# Patient Record
Sex: Male | Born: 1990 | Race: Black or African American | Hispanic: No | Marital: Single | Smoking: Never smoker
Health system: Southern US, Community
[De-identification: ages and names within clinical notes are randomized; demographics above are authoritative.]

## PROBLEM LIST (undated history)

## (undated) DIAGNOSIS — F32A Depression, unspecified: Secondary | ICD-10-CM

## (undated) DIAGNOSIS — K604 Rectal fistula: Secondary | ICD-10-CM

## (undated) DIAGNOSIS — K610 Anal abscess: Secondary | ICD-10-CM

## (undated) DIAGNOSIS — T7840XA Allergy, unspecified, initial encounter: Secondary | ICD-10-CM

## (undated) DIAGNOSIS — F419 Anxiety disorder, unspecified: Secondary | ICD-10-CM

## (undated) HISTORY — DX: Anxiety disorder, unspecified: F41.9

## (undated) HISTORY — PX: UPPER GASTROINTESTINAL ENDOSCOPY: SHX188

## (undated) HISTORY — PX: HERNIA REPAIR: SHX51

## (undated) HISTORY — DX: Anal abscess: K61.0

## (undated) HISTORY — DX: Allergy, unspecified, initial encounter: T78.40XA

## (undated) HISTORY — DX: Rectal fistula: K60.4

## (undated) HISTORY — DX: Depression, unspecified: F32.A

---
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
## 2013-10-17 HISTORY — PX: INCISION AND DRAINAGE PERIRECTAL ABSCESS: SHX1804

## 2013-10-18 ENCOUNTER — Ambulatory Visit (INDEPENDENT_AMBULATORY_CARE_PROVIDER_SITE_OTHER): Payer: BC Managed Care – PPO | Admitting: Surgery

## 2013-10-18 ENCOUNTER — Encounter (INDEPENDENT_AMBULATORY_CARE_PROVIDER_SITE_OTHER): Payer: Self-pay | Admitting: Surgery

## 2013-10-18 ENCOUNTER — Encounter (INDEPENDENT_AMBULATORY_CARE_PROVIDER_SITE_OTHER): Payer: Self-pay

## 2013-10-18 VITALS — BP 110/80 | HR 64 | Temp 98.2°F | Resp 14 | Ht 77.0 in | Wt 171.6 lb

## 2013-10-18 DIAGNOSIS — K610 Anal abscess: Secondary | ICD-10-CM

## 2013-10-18 DIAGNOSIS — K612 Anorectal abscess: Secondary | ICD-10-CM

## 2013-10-18 DIAGNOSIS — K602 Anal fissure, unspecified: Secondary | ICD-10-CM

## 2013-10-18 HISTORY — DX: Anal abscess: K61.0

## 2013-10-18 MED ORDER — NAPROXEN 500 MG PO TABS
500.0000 mg | ORAL_TABLET | Freq: Two times a day (BID) | ORAL | Status: DC
Start: 2013-10-18 — End: 2013-12-04

## 2013-10-18 MED ORDER — OXYCODONE HCL 5 MG PO TABS: 5.0000 mg | ORAL_TABLET | Freq: Four times a day (QID) | ORAL | Status: DC | PRN

## 2013-10-18 MED ORDER — AMOXICILLIN-POT CLAVULANATE 875-125 MG PO TABS: 1.0000 | ORAL_TABLET | Freq: Two times a day (BID) | ORAL | Status: DC

## 2013-10-18 NOTE — Progress Notes (Signed)
Subjective:     Patient ID: Billy Garcia, male   DOB: 1991-04-14, 23 y.o.   MRN: [id number]  HPI  Note: This dictation was prepared with Dragon/digital dictation along with [facility] technology. Any transcriptional errors that result from this process are unintentional.       Billy Garcia  08/15/1991 [id number]  Patient Care Team: Billy Garcia as Referring Physician (Emergency Medicine)  This patient is a 23 y.o.male who presents today for surgical evaluation at the request of Dr. Steva Garcia.   Reason for visit: Pain at anus.  Concern for abscess  Billy Garcia male with no history of prior anal rectal problems.  He is noted a tough spot and his anus but for the past 6 weeks.  It has been painful.  It seems to be on one side.  He usually has one to 2 bowel movements a day.  He was given a trial of Keflex from an urgent care office.  The pain went down a little it but it did not resolve.  His become more painful off antibiotics.  He gets some mild constipation and diarrhea but not severe.  No history of anorectal trauma.  Because it worsened, surgical consultation requested urgently.  Patient Active Problem List   Diagnosis Date Noted  . Perianal abscess, right posterior s/o I&D 10/18/2013 10/18/2013  . Anal fissure 10/18/2013    History reviewed. No pertinent past medical history.  History reviewed. No pertinent past surgical history.  History   Social History  . Marital Status: Single    Spouse Name: N/A    Number of Children: N/A  . Years of Education: N/A   Occupational History  . Not on file.   Social History Main Topics  . Smoking status: Never Smoker   . Smokeless tobacco: Never Used  . Alcohol Use: Yes     Comment: occasional  . Drug Use: No  . Sexual Activity: Not on file   Other Topics Concern  . Not on file   Social History Narrative  . No narrative on file    History reviewed. No pertinent family history.  Current Outpatient Prescriptions    Medication Sig Dispense Refill  . amoxicillin-clavulanate (AUGMENTIN) 875-125 MG per tablet Take 1 tablet by mouth 2 (two) times daily.  14 tablet  3  . naproxen (NAPROSYN) 500 MG tablet Take 1 tablet (500 mg total) by mouth 2 (two) times daily with a meal.  30 tablet  1  . oxyCODONE (OXY IR/ROXICODONE) 5 MG immediate release tablet Take 1-2 tablets (5-10 mg total) by mouth every 6 (six) hours as needed for moderate pain, severe pain or breakthrough pain.  30 tablet  0   No current facility-administered medications for this visit.     Not on File  BP 110/80  Pulse 64  Temp(Src) 98.2 F (36.8 C) (Temporal)  Resp 14  Ht 6\' 5"  (1.956 m)  Wt 171 lb 9.6 oz (77.837 kg)  BMI 20.34 kg/m2  No results found.   Review of Systems  Constitutional: Positive for chills. Negative for fever, diaphoresis and fatigue.  HENT: Negative for sore throat and trouble swallowing.   Eyes: Negative for photophobia and visual disturbance.  Respiratory: Negative for choking and shortness of breath.   Cardiovascular: Negative for chest pain and palpitations.  Gastrointestinal: Positive for rectal pain. Negative for nausea, vomiting, abdominal pain, diarrhea, constipation, blood in stool, abdominal distention and anal bleeding.  Genitourinary: Negative for dysuria, urgency, difficulty urinating and testicular pain.  Musculoskeletal: Negative for arthralgias, gait problem, myalgias and neck pain.  Skin: Negative for color change and rash.  Neurological: Negative for dizziness, speech difficulty, weakness and numbness.  Hematological: Negative for adenopathy.  Psychiatric/Behavioral: Negative for hallucinations, confusion and agitation.       Objective:   Physical Exam  Constitutional: He is oriented to person, place, and time. He appears well-developed and well-nourished. No distress.  HENT:  Head: Normocephalic.  Mouth/Throat: Oropharynx is clear and moist. No oropharyngeal exudate.  Eyes: Conjunctivae  and EOM are normal. Pupils are equal, round, and reactive to light. No scleral icterus.  Neck: Normal range of motion. No tracheal deviation present.  Cardiovascular: Normal rate, normal heart sounds and intact distal pulses.   Pulmonary/Chest: Effort normal. No respiratory distress.  Abdominal: Soft. He exhibits no distension. There is no tenderness. Hernia confirmed negative in the right inguinal area and confirmed negative in the left inguinal area.  Incisions clean with normal healing ridges.  No hernias  Genitourinary:     Exam done with assistance of male Medical Assistant in the room.  Perianal skin clean with good hygiene.  No pruritis.  No pilonidal disease.      3cm fluctant tender mass c/w abscess w 2 pinholes. Chronic posterior anal fissures  No external hemorrhoids.  Normal sphincter tone.  Tolerates digital rectal exam.  No rectal masses.  Hemorrhoidal piles WNL   Musculoskeletal: Normal range of motion. He exhibits no tenderness.  Neurological: He is alert and oriented to person, place, and time. No cranial nerve deficit. He exhibits normal muscle tone. Coordination normal.  Skin: Skin is warm and dry. No rash noted. He is not diaphoretic.  Psychiatric: He has a normal mood and affect. His behavior is normal.       Assessment:     Perianal abscess.  Inadequately draining.  Refractory to antibiotics.  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    Plan:     I drained it under local in the office:   The anatomy & physiology of the anorectal region was discussed.  The pathophysiology of anorectal abscess and differential diagnosis was discussed.  Natural history progression such as fistula, gangrene, etc was discussed.   I stressed the importance of a bowel regimen to have daily soft bowel movements to minimize progression of disease.     The patient's symptoms are not adequately controlled.  Non-operative treatment has not healed the  abscess.  Therefore, I recommended incision & drainage of the abscess to allow the infection to resolve and heal.  Technique, risks, benefits, alternatives discussed.  The patient expressed understanding & wished to proceed.  The patient was positioned lateral decubitus.  I placed a field block with local anaesthetic.  I incised the skin over the abscess to release the infection.  I excised skin at the wound to have an adequate opening for drainage & prevent skin reclosure.  I packed the wound with ribbon NU-Gauze.  The patient tolerated the procedure.   Educational handouts further explaining the pathology, treatment options, and bowel regimen were given.  We will have the patient return to clinic for close follow up to make sure the infection heals  I suspect anal fissure is probably chronic.  I would allow the abscess to heal first before trying to treat the anal fissure.  Perhaps that will resolve after drainage with this.  We will say.  Otherwise, start diltiazem cream as usual:  The anatomy & physiology of  the anorectal region was discussed.  The pathophysiology of anal fissure and differential diagnosis was discussed.  Natural history progression  was discussed.   I stressed the importance of a bowel regimen to have daily soft bowel movements to minimize progression of disease.   I discussed the use of warm soaks &  muscle relaxant, diltiazem, to help the anal sphincter relax, allow the spasming to stop, and help the tear/fissure to heal.  If non-operative treatment does not heal the fissure, I would recommend examination under anesthesia for better examination to confirm the diagnosis and treat by lateral internal sphincterotomy to allow the fissure to heal.  Technique, benefits, alternatives discussed.  Risks such as bleeding, pain, incontinence, recurrence, heart attack, death, and other risks were discussed.    Educational handouts further explaining the pathology, treatment options, and bowel  regimen were given as well.  The patient expressed understanding & will follow up PRN.  If the pain does not resolve in a few weeks or worsens, he should proceed with surgery.

## 2013-10-18 NOTE — Patient Instructions (Addendum)
ANORECTAL SURGERY: POST OP INSTRUCTIONS  1. Take your usually prescribed home medications unless otherwise directed. 2. DIET: Follow a light bland diet the first 24 hours after arrival home, such as soup, liquids, crackers, etc.  Be sure to include lots of fluids daily.  Avoid fast food or heavy meals as your are more likely to get nauseated.  Eat a low fat the next few days after surgery.   3. PAIN CONTROL: a. Pain is best controlled by a usual combination of three different methods TOGETHER: i. Ice/Heat ii. Over the counter pain medication iii. Prescription pain medication b. Most patients will experience some swelling and discomfort in the anus/rectal area. and incisions.  Ice packs or heat (30-60 minutes up to 6 times a day) will help. Use ice for the first few days to help decrease swelling and bruising, then switch to heat such as warm towels, sitz baths, warm baths, etc to help relax tight/sore spots and speed recovery.  Some people prefer to use ice alone, heat alone, alternating between ice & heat.  Experiment to what works for you.  Swelling and bruising can take several weeks to resolve.   c. It is helpful to take an over-the-counter pain medication regularly for the first few weeks.  Choose one of the following that works best for you: i. Naproxen (Aleve, etc)  Two 220mg tabs twice a day ii. Ibuprofen (Advil, etc) Three 200mg tabs four times a day (every meal & bedtime) iii. Acetaminophen (Tylenol, etc) 500-650mg four times a day (every meal & bedtime) d. A  prescription for pain medication (such as oxycodone, hydrocodone, etc) should be given to you upon discharge.  Take your pain medication as prescribed.  i. If you are having problems/concerns with the prescription medicine (does not control pain, nausea, vomiting, rash, itching, etc), please call us [phone] to see if we need to switch you to a different pain medicine that will work better for you and/or control your side effect  better. ii. If you need a refill on your pain medication, please contact your pharmacy.  They will contact our office to request authorization. Prescriptions will not be filled after 5 pm or on week-ends.  Use a Sitz Bath 4-8 times a day for relief A sitz bath is a warm water bath taken in the sitting position that covers only the hips and buttocks. It may be used for either healing or hygiene purposes. Sitz baths are also used to relieve pain, itching, or muscle spasms. The water may contain medicine. Moist heat will help you heal and relax.  HOME CARE INSTRUCTIONS  Take 3 to 4 sitz baths a day. 1. Fill the bathtub half full with warm water. 2. Sit in the water and open the drain a little. 3. Turn on the warm water to keep the tub half full. Keep the water running constantly. 4. Soak in the water for 15 to 20 minutes. 5. After the sitz bath, pat the affected area dry first. SEEK MEDICAL CARE IF:  You get worse instead of better. Stop the sitz baths if you get worse.   4. KEEP YOUR BOWELS REGULAR a. The goal is one bowel movement a day b. Avoid getting constipated.  Between the surgery and the pain medications, it is common to experience some constipation.  Increasing fluid intake and taking a fiber supplement (such as Metamucil, Citrucel, FiberCon, MiraLax, etc) 1-2 times a day regularly will usually help prevent this problem from occurring.  A mild   laxative (prune juice, Milk of Magnesia, MiraLax, etc) should be taken according to package directions if there are no bowel movements after 48 hours. c. Watch out for diarrhea.  If you have many loose bowel movements, simplify your diet to bland foods & liquids for a few days.  Stop any stool softeners and decrease your fiber supplement.  Switching to mild anti-diarrheal medications (Kayopectate, Pepto Bismol) can help.  If this worsens or does not improve, please call us.  5. Wound Care a. Remove your bandages the day after surgery.  Unless  discharge instructions indicate otherwise, leave your bandage dry and in place overnight.  Remove the bandage during your first bowel movement.   b. Allow the wound packing to fall out over the next few days.  You can trim exposed gauze / ribbon as it falls out.  You do not need to repack the wound unless instructed otherwise.  Wear an absorbent pad or soft cotton gauze in your underwear as needed to catch any drainage and help keep the area  c. Keep the area clean and dry.  Bathe / shower every day.  Keep the area clean by showering / bathing over the incision / wound.   It is okay to soak an open wound to help wash it.  Wet wipes or showers / gentle washing after bowel movements is often less traumatic than regular toilet paper. d. You may have some styrofoam-like soft packing in the rectum which will come out with the first bowel movement.  e. You will often notice bleeding with bowel movements.  This should slow down by the end of the first week of surgery f. Expect some drainage.  This should slow down, too, by the end of the first week of surgery.  Wear an absorbent pad or soft cotton gauze in your underwear until the drainage stops. 6. ACTIVITIES as tolerated:   a. You may resume regular (light) daily activities beginning the next day-such as daily self-care, walking, climbing stairs-gradually increasing activities as tolerated.  If you can walk 30 minutes without difficulty, it is safe to try more intense activity such as jogging, treadmill, bicycling, low-impact aerobics, swimming, etc. b. Save the most intensive and strenuous activity for last such as sit-ups, heavy lifting, contact sports, etc  Refrain from any heavy lifting or straining until you are off narcotics for pain control.   c. DO NOT PUSH THROUGH PAIN.  Let pain be your guide: If it hurts to do something, don't do it.  Pain is your body warning you to avoid that activity for another week until the pain goes down. d. You may drive when  you are no longer taking prescription pain medication, you can comfortably sit for long periods of time, and you can safely maneuver your car and apply brakes. e. You may have sexual intercourse when it is comfortable.  7. FOLLOW UP in our office a. Please call [facility] at [phone] to set up an appointment to see your surgeon in the office for a follow-up appointment approximately 2 weeks after your surgery. b. Make sure that you call for this appointment the day you arrive home to insure a convenient appointment time. 10. IF YOU HAVE DISABILITY OR FAMILY LEAVE FORMS, BRING THEM TO THE OFFICE FOR PROCESSING.  DO NOT GIVE THEM TO YOUR DOCTOR.        WHEN TO CALL US [phone]: 1. Poor pain control 2. Reactions / problems with new medications (rash/itching, nausea, etc)    3. Fever over 101.5 F (38.5 C) 4. Inability to urinate 5. Nausea and/or vomiting 6. Worsening swelling or bruising 7. Continued bleeding from incision. 8. Increased pain, redness, or drainage from the incision  The clinic staff is available to answer your questions during regular business hours (8:30am-5pm).  Please don't hesitate to call and ask to speak to one of our nurses for clinical concerns.   A surgeon from [facility] is always on call at the hospitals   If you have a medical emergency, go to the nearest emergency room or call 911.    [facility] [address], [address], [place], [place]  [id number] ? MAIN: [phone] ? TOLL FREE: [phone] ? FAX [phone] [email or web address]   Perianal Abscess An abscess is an infected area that contains a collection of pus and debris. A perianal abscess is one that occurs in the perineal area, which is the area between the anus and the scrotum in males and between the anus and the vagina in females. Perianal abscesses can vary in size. Without treatment, a perianal abscess can become larger and cause other  problems. CAUSES  Glands in the perineal area can become plugged up with debris. When this happens, an abscess may form.  SIGNS AND SYMPTOMS  The most common symptoms of a perianal abscess are:  Swelling and redness in the area of the abscess. The redness may go beyond the abscess and appear as a red streak on the skin.  Pain in the area of the abscess. Other possible symptoms include:   A visible lump or a lump that can be felt when touching the area and is usually painful.  Bleeding or pus-like discharge from the area.  Fever.  General weakness. DIAGNOSIS  Your health care provider will take a medical history and examine the area. This may involve examining the rectal area with a gloved hand (digital rectal exam). For women, it may require a careful vaginal exam. Sometimes, the health care provider needs to look into the rectum using a probe or scope. TREATMENT  Treatment often requires making a cut (incision) in the abscess to drain the pus. This can sometimes be done in your health care provider's office or an emergency department after giving you medicine to numb the area (local anesthetic). For larger or deeper abscesses, surgery may be required to drain the abscess. Antibiotic medicines are sometimes given if there is infection of the surrounding tissue (cellulitis). In some cases, gauze is packed into the abscess to continue draining the area. Frequent sitz baths may be recommended to help the wound heal and to reduce the chance of the abscess coming back. HOME CARE INSTRUCTIONS   Only take over-the-counter or prescription medicines for pain, fever, or discomfort as directed by your health care provider.  Take antibiotic medicine as directed. Make sure you finish it even if you start to feel better.  If gauze is used in the abscess, follow your health care provider's instructions for removing or changing the gauze. It can usually be removed in 2 3 days.  If one or more drains  have been placed in the abscess cavity, be careful not to pull at them. Your health care provider will tell you how long they need to remain in place.  Take warm sitz baths 3 4 times a day and after bowel movements. This will help reduce pain and swelling.  Keep the skin around the abscess clean and dry. Avoid cleaning the  area too much.  Avoid scratching the abscess area.  Avoid using colored or perfumed toilet papers. SEEK MEDICAL CARE IF:   You have trouble having a bowel movement or passing urine.  Your pain or swelling in the affected area does not seem to be improving.  The gauze packing or the drains come out before the planned time. SEEK IMMEDIATE MEDICAL CARE IF:   You have problems moving or using your legs.  You have severe or increasing pain.  Your swelling in the affected area suddenly gets worse.  You have a large increase in bleeding or passing of pus.  You have chills or a fever. MAKE SURE YOU:   Understand these instructions.  Will watch your condition.  Will get help right away if you are not doing well or get worse. Document Released: 10/26/2006 Document Revised: 07/10/2013 Document Reviewed: 05/01/2013 [facility] Patient Information 2014 [place], [place].   GETTING TO GOOD BOWEL HEALTH. Irregular bowel habits such as constipation and diarrhea can lead to many problems over time.  Having one soft bowel movement a day is the most important way to prevent further problems.  The anorectal canal is designed to handle stretching and feces to safely manage our ability to get rid of solid waste (feces, poop, stool) out of our body.  BUT, hard constipated stools can act like ripping concrete bricks and diarrhea can be a burning fire to this very sensitive area of our body, causing inflamed hemorrhoids, anal fissures, increasing risk is perirectal abscesses, abdominal pain/bloating, an making irritable bowel worse.     The goal: ONE SOFT BOWEL MOVEMENT A DAY!  To have  soft, regular bowel movements:    Drink at least 8 tall glasses of water a day.     Take plenty of fiber.  Fiber is the undigested part of plant food that passes into the colon, acting s "natures broom" to encourage bowel motility and movement.  Fiber can absorb and hold large amounts of water. This results in a larger, bulkier stool, which is soft and easier to pass. Work gradually over several weeks up to 6 servings a day of fiber (25g a day even more if needed) in the form of: o Vegetables -- Root (potatoes, carrots, turnips), leafy green (lettuce, salad greens, celery, spinach), or cooked high residue (cabbage, broccoli, etc) o Fruit -- Fresh (unpeeled skin & pulp), Dried (prunes, apricots, cherries, etc ),  or stewed ( applesauce)  o Whole grain breads, pasta, etc (whole wheat)  o Bran cereals    Bulking Agents -- This type of water-retaining fiber generally is easily obtained each day by one of the following:  o Psyllium bran -- The psyllium plant is remarkable because its ground seeds can retain so much water. This product is available as Metamucil, Konsyl, Effersyllium, Per Diem Fiber, or the less expensive generic preparation in drug and health food stores. Although labeled a laxative, it really is not a laxative.  o Methylcellulose -- This is another fiber derived from wood which also retains water. It is available as Citrucel. o Polyethylene Glycol - and "artificial" fiber commonly called Miralax or Glycolax.  It is helpful for people with gassy or bloated feelings with regular fiber o Flax Seed - a less gassy fiber than psyllium   No reading or other relaxing activity while on the toilet. If bowel movements take longer than 5 minutes, you are too constipated   AVOID CONSTIPATION.  High fiber and water intake usually takes care of  this.  Sometimes a laxative is needed to stimulate more frequent bowel movements, but    Laxatives are not a good long-term solution as it can wear the colon  out. o Osmotics (Milk of Magnesia, Fleets phosphosoda, Magnesium citrate, MiraLax, GoLytely) are safer than  o Stimulants (Senokot, Castor Oil, Dulcolax, Ex Lax)    o Do not take laxatives for more than 7days in a row.    IF SEVERELY CONSTIPATED, try a Bowel Retraining Program: o Do not use laxatives.  o Eat a diet high in roughage, such as bran cereals and leafy vegetables.  o Drink six (6) ounces of prune or apricot juice each morning.  o Eat two (2) large servings of stewed fruit each day.  o Take one (1) heaping tablespoon of a psyllium-based bulking agent twice a day. Use sugar-free sweetener when possible to avoid excessive calories.  o Eat a normal breakfast.  o Set aside 15 minutes after breakfast to sit on the toilet, but do not strain to have a bowel movement.  o If you do not have a bowel movement by the third day, use an enema and repeat the above steps.    Controlling diarrhea o Switch to liquids and simpler foods for a few days to avoid stressing your intestines further. o Avoid dairy products (especially milk & ice cream) for a short time.  The intestines often can lose the ability to digest lactose when stressed. o Avoid foods that cause gassiness or bloating.  Typical foods include beans and other legumes, cabbage, broccoli, and dairy foods.  Every person has some sensitivity to other foods, so listen to our body and avoid those foods that trigger problems for you. o Adding fiber (Citrucel, Metamucil, psyllium, Miralax) gradually can help thicken stools by absorbing excess fluid and retrain the intestines to act more normally.  Slowly increase the dose over a few weeks.  Too much fiber too soon can backfire and cause cramping & bloating. o Probiotics (such as active yogurt, Align, etc) may help repopulate the intestines and colon with normal bacteria and calm down a sensitive digestive tract.  Most studies show it to be of mild help, though, and such products can be  costly. o Medicines:   Bismuth subsalicylate (ex. Kayopectate, Pepto Bismol) every 30 minutes for up to 6 doses can help control diarrhea.  Avoid if pregnant.   Loperamide (Immodium) can slow down diarrhea.  Start with two tablets (4mg  total) first and then try one tablet every 6 hours.  Avoid if you are having fevers or severe pain.  If you are not better or start feeling worse, stop all medicines and call your doctor for advice o Call your doctor if you are getting worse or not better.  Sometimes further testing (cultures, endoscopy, X-ray studies, bloodwork, etc) may be needed to help diagnose and treat the cause of the diarrhea.  Anal Fissure, Adult An anal fissure is a small tear or crack in the skin around the anus. Bleeding from a fissure usually stops on its own within a few minutes. However, bleeding will often reoccur with each bowel movement until the crack heals.  CAUSES   Passing large, hard stools.  Frequent diarrheal stools.  Constipation.  Inflammatory bowel disease (Crohn's disease or ulcerative colitis).  Infections.  Anal sex. SYMPTOMS   Small amounts of blood seen on your stools, on toilet paper, or in the toilet after a bowel movement.  Rectal bleeding.  Painful bowel movements.  Itching or  irritation around the anus. DIAGNOSIS Your caregiver will examine the anal area. An anal fissure can usually be seen with careful inspection. A rectal exam may be performed and a short tube (anoscope) may be used to examine the anal canal. TREATMENT   You may be instructed to take fiber supplements. These supplements can soften your stool to help make bowel movements easier.  Sitz baths may be recommended to help heal the tear. Do not use soap in the sitz baths.  A medicated cream or ointment may be prescribed to lessen discomfort. HOME CARE INSTRUCTIONS   Maintain a diet high in fruits, whole grains, and vegetables. Avoid constipating foods like bananas and dairy  products.  Take sitz baths as directed by your caregiver.  Drink enough fluids to keep your urine clear or pale yellow.  Only take over-the-counter or prescription medicines for pain, discomfort, or fever as directed by your caregiver. Do not take aspirin as this may increase bleeding.  Do not use ointments containing numbing medications (anesthetics) or hydrocortisone. They could slow healing. SEEK MEDICAL CARE IF:   Your fissure is not completely healed within 3 days.  You have further bleeding.  You have a fever.  You have diarrhea mixed with blood.  You have pain.  Your problem is getting worse rather than better. MAKE SURE YOU:   Understand these instructions.  Will watch your condition.  Will get help right away if you are not doing well or get worse. Document Released: 09/19/2005 Document Revised: 12/12/2011 Document Reviewed: 03/06/2011 [facility] Patient Information 2014 [place], [place].

## 2013-10-23 ENCOUNTER — Telehealth (INDEPENDENT_AMBULATORY_CARE_PROVIDER_SITE_OTHER): Payer: Self-pay

## 2013-10-23 NOTE — Telephone Encounter (Signed)
LMOM giving pt his f/u appt with Dr Michaell CowingGross for 11/01/13 arrive 8:45/9:00.

## 2013-11-01 ENCOUNTER — Encounter (INDEPENDENT_AMBULATORY_CARE_PROVIDER_SITE_OTHER): Payer: Self-pay | Admitting: Surgery

## 2013-11-01 ENCOUNTER — Ambulatory Visit (INDEPENDENT_AMBULATORY_CARE_PROVIDER_SITE_OTHER): Payer: BC Managed Care – PPO | Admitting: Surgery

## 2013-11-01 VITALS — BP 124/68 | HR 72 | Temp 98.0°F | Resp 18 | Ht 76.5 in | Wt 177.0 lb

## 2013-11-01 DIAGNOSIS — K602 Anal fissure, unspecified: Secondary | ICD-10-CM

## 2013-11-01 DIAGNOSIS — K612 Anorectal abscess: Secondary | ICD-10-CM

## 2013-11-01 DIAGNOSIS — K610 Anal abscess: Secondary | ICD-10-CM

## 2013-11-01 MED ORDER — AMBULATORY NON FORMULARY MEDICATION: 1.0000 "application " | Freq: Four times a day (QID) | Status: DC

## 2013-11-01 NOTE — Progress Notes (Signed)
Subjective:     Patient ID: Billy Garcia, male   DOB: 08-22-1991, 23 y.o.   MRN: [id number]  HPI   Note: This dictation was prepared with Dragon/digital dictation along with [facility] technology. Any transcriptional errors that result from this process are unintentional.       Billy Garcia  January 22, 1991 [id number]  Patient Care Team: Judith Part as Referring Physician (Emergency Medicine)  This patient is a 23 y.o.male who presents today for surgical evaluation at the request of Dr. Steva Ready.   Reason for visit: Pain at anus.  Abscess & fissure  Procedure:  Incision and drainage of perirectal abscess 10/18/2013  Billy Garcia male with no history of prior anal rectal problems.  He is noted a tough spot and his anus but for the past 6 weeks.  It has been painful.  It seems to be on one side.  He usually has one to 2 bowel movements a day.  He was given a trial of Keflex from an urgent care office.  The pain went down a little it but it did not resolve.  His become more painful off antibiotics.  He gets some mild constipation and diarrhea but not severe.  No history of anorectal trauma.  Because it worsened, surgical consultation requested urgently.  Drain an abscess there 2 weeks ago.  He comes today for followup.  The pain started ago after 2 days from the drainage.  He feels much better.  Moving bowels well.  Less discomfort with bowel movements.  Not a sharp pain.  No bad diarrhea or constipation  Patient Active Problem List   Diagnosis Date Noted  . Perianal abscess, right posterior s/o I&D 10/18/2013 10/18/2013  . Anal fissure 10/18/2013    No past medical history on file.  No past surgical history on file.  History   Social History  . Marital Status: Single    Spouse Name: N/A    Number of Children: N/A  . Years of Education: N/A   Occupational History  . Not on file.   Social History Main Topics  . Smoking status: Never Smoker   . Smokeless tobacco: Never Used  .  Alcohol Use: Yes     Comment: occasional  . Drug Use: No  . Sexual Activity: Not on file   Other Topics Concern  . Not on file   Social History Narrative  . No narrative on file    No family history on file.  Current Outpatient Prescriptions  Medication Sig Dispense Refill  . amoxicillin-clavulanate (AUGMENTIN) 875-125 MG per tablet Take 1 tablet by mouth 2 (two) times daily.  14 tablet  3  . naproxen (NAPROSYN) 500 MG tablet Take 1 tablet (500 mg total) by mouth 2 (two) times daily with a meal.  30 tablet  1  . oxyCODONE (OXY IR/ROXICODONE) 5 MG immediate release tablet Take 1-2 tablets (5-10 mg total) by mouth every 6 (six) hours as needed for moderate pain, severe pain or breakthrough pain.  30 tablet  0   No current facility-administered medications for this visit.     No Known Allergies  BP 124/68  Pulse 72  Temp(Src) 98 F (36.7 C)  Resp 18  Ht 6' 4.5" (1.943 m)  Wt 177 lb (80.287 kg)  BMI 21.27 kg/m2  No results found.   Review of Systems  Constitutional: Positive for chills. Negative for fever, diaphoresis and fatigue.  HENT: Negative for sore throat and trouble swallowing.   Eyes: Negative for photophobia  and visual disturbance.  Respiratory: Negative for choking and shortness of breath.   Cardiovascular: Negative for chest pain and palpitations.  Gastrointestinal: Positive for rectal pain. Negative for nausea, vomiting, abdominal pain, diarrhea, constipation, blood in stool, abdominal distention and anal bleeding.  Genitourinary: Negative for dysuria, urgency, difficulty urinating and testicular pain.  Musculoskeletal: Negative for arthralgias, gait problem, myalgias and neck pain.  Skin: Negative for color change and rash.  Neurological: Negative for dizziness, speech difficulty, weakness and numbness.  Hematological: Negative for adenopathy.  Psychiatric/Behavioral: Negative for hallucinations, confusion and agitation.       Objective:   Physical Exam   Constitutional: He is oriented to person, place, and time. He appears well-developed and well-nourished. No distress.  HENT:  Head: Normocephalic.  Mouth/Throat: Oropharynx is clear and moist. No oropharyngeal exudate.  Eyes: Conjunctivae and EOM are normal. Pupils are equal, round, and reactive to light. No scleral icterus.  Neck: Normal range of motion. No tracheal deviation present.  Cardiovascular: Normal rate, normal heart sounds and intact distal pulses.   Pulmonary/Chest: Effort normal. No respiratory distress.  Abdominal: Soft. He exhibits no distension. There is no tenderness. Hernia confirmed negative in the right inguinal area and confirmed negative in the left inguinal area.  Genitourinary: Rectal exam shows fissure.     Exam done with assistance of male Medical Assistant in the room.  Perianal skin clean with good hygiene.  No pruritis.  No pilonidal disease.  No external hemorrhoids.  Increased sphincter tone.    No rectal masses.  Hemorrhoidal piles WNL   Musculoskeletal: Normal range of motion. He exhibits no tenderness.  Neurological: He is alert and oriented to person, place, and time. No cranial nerve deficit. He exhibits normal muscle tone. Coordination normal.  Skin: Skin is warm and dry. No rash noted. He is not diaphoretic.  Psychiatric: He has a normal mood and affect. His behavior is normal.       Assessment:     Perianal abscess.  Inadequately draining.  Refractory to antibiotics.  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  I again discussed with him about starting diltiazem.  He seems skeptical at first, but I think he will agree with the recommendations:  The anatomy & physiology of the anorectal region was discussed.  The pathophysiology of anal fissure and differential diagnosis was discussed.  Natural history progression  was discussed.   I stressed the  importance of a bowel regimen to have daily soft bowel movements to minimize progression of disease.   I discussed the use of warm soaks &  muscle relaxant, diltiazem, to help the anal sphincter relax, allow the spasming to stop, and help the tear/fissure to heal.  If non-operative treatment does not heal the fissure, I would recommend examination under anesthesia for better examination to confirm the diagnosis and treat by lateral internal sphincterotomy to allow the fissure to heal.  Technique, benefits, alternatives discussed.  Risks such as bleeding, pain, incontinence, recurrence, heart attack, death, and other risks were discussed.    Educational handouts further explaining the pathology, treatment options, and bowel regimen were given as well.  The patient expressed understanding & will follow up PRN.  If the pain does not resolve in a few weeks or worsens, he should proceed with surgery.  The abscess is nearly healed.  Hopefully he will not develop a fistula we will see.  Okay to stop antibiotics and continue hygiene.

## 2013-11-01 NOTE — Patient Instructions (Signed)
WHAT IS AN ANAL FISSURE? An anal fissure (fissure-in-ano) is a small, oval shaped tear in skin that lines the opening of the anus. Fissures typically cause severe pain and bleeding with bowel movements. Fissures are quite common in the general population, but are often confused with other causes of pain and bleeding, such as hemorrhoids.  WHAT ARE THE SYMPTOMS OF AN ANAL FISSURE? The typical symptoms of an anal fissure include severe pain during, and especially after, a bowel movement, lasting from several minutes to a few hours. Patients may also notice bright red blood from the anus that can be seen on the toilet paper or on the stool. Between bowel movements, patients with anal fissures are often relatively symptom-free. Many patients are fearful of having a bowel movement and may try to avoid defecation secondary to the pain.   WHAT CAUSES AN ANAL FISSURE? Fissures are usually caused by trauma to the inner lining of the anus. Patients with tight anal sphincter muscles (i.e., increased muscle tone) are more prone to developing anal fissures. A hard, dry bowel movement is typically responsible, but loose stools and diarrhea can also be the cause. Following a bowel movement, severe anal pain can produce spasm of the anal sphincter muscle, resulting in a decrease in blood flow to the site of the injury, thus impairing healing of the wound. The next bowel movement results in more pain, anal spasm, decreased blood flow to the area, and the cycle continues. Treatments are aimed at interrupting this cycle by relaxing the anal sphincter muscle to promote healing of the fissure.   Other, less common, causes include inflammatory conditions and certain anal infections or tumors. Anal fissures may be acute (recent onset) or chronic (present for a long period of time). Chronic fissures may be more difficult to treat, and may also have an external lump associated with the tear, called a sentinel pile or skin tag, as  well as extra tissue just inside the anal canal (hypertrophied papilla) .  WHAT IS THE TREATMENT OF ANAL FISSURES? The majority of anal fissures do not require surgery. The most common treatment for an acute anal fissure consists of making the stool more formed and bulky with a diet high in fiber and utilization of over-the-counter fiber supplementation (totaling 25-35 grams of fiber/day). Stool softeners and increasing water intake may be necessary to promote soft bowel movements and aid in the healing process. Topical anesthetics for pain and warm tub baths (sitz baths) for 10-20 minutes several times a day (especially after bowel movements) are soothing and promote relaxation of the anal muscles, which may help the healing process.  Other medications (such as diltiazem) may be prescribed that allow relaxation of the anal sphincter muscles. Your surgeon will go over benefits and side-effects of each of these with you. Narcotic pain medications are not recommended for anal fissures, as they promote constipation. Chronic fissures are generally more difficult to treat, and your surgeon may advise surgical treatment.  WILL THE PROBLEM RETURN? Fissures can recur easily, and it is quite common for a fully healed fissure to recur after a hard bowel movement or other trauma. Even when the pain and bleeding have subsided, it is very important to continue good bowel habits and a diet high in fiber as a lifestyle change. If the problem returns without an obvious cause, further assessment is warranted.  GETTING TO GOOD BOWEL HEALTH. Irregular bowel habits such as constipation and diarrhea can lead to many problems over time.  Having one  soft bowel movement a day is the most important way to prevent further problems.  The anorectal canal is designed to handle stretching and feces to safely manage our ability to get rid of solid waste (feces, poop, stool) out of our body.  BUT, hard constipated stools can act like  ripping concrete bricks and diarrhea can be a burning fire to this very sensitive area of our body, causing inflamed hemorrhoids, anal fissures, increasing risk is perirectal abscesses, abdominal pain/bloating, an making irritable bowel worse.     The goal: ONE SOFT BOWEL MOVEMENT A DAY!  To have soft, regular bowel movements:    Drink at least 8 tall glasses of water a day.     Take plenty of fiber.  Fiber is the undigested part of plant food that passes into the colon, acting s "natures broom" to encourage bowel motility and movement.  Fiber can absorb and hold large amounts of water. This results in a larger, bulkier stool, which is soft and easier to pass. Work gradually over several weeks up to 6 servings a day of fiber (25g a day even more if needed) in the form of: o Vegetables -- Root (potatoes, carrots, turnips), leafy green (lettuce, salad greens, celery, spinach), or cooked high residue (cabbage, broccoli, etc) o Fruit -- Fresh (unpeeled skin & pulp), Dried (prunes, apricots, cherries, etc ),  or stewed ( applesauce)  o Whole grain breads, pasta, etc (whole wheat)  o Bran cereals    Bulking Agents -- This type of water-retaining fiber generally is easily obtained each day by one of the following:  o Psyllium bran -- The psyllium plant is remarkable because its ground seeds can retain so much water. This product is available as Metamucil, Konsyl, Effersyllium, Per Diem Fiber, or the less expensive generic preparation in drug and health food stores. Although labeled a laxative, it really is not a laxative.  o Methylcellulose -- This is another fiber derived from wood which also retains water. It is available as Citrucel. o Polyethylene Glycol - and "artificial" fiber commonly called Miralax or Glycolax.  It is helpful for people with gassy or bloated feelings with regular fiber o Flax Seed - a less gassy fiber than psyllium   No reading or other relaxing activity while on the toilet. If bowel  movements take longer than 5 minutes, you are too constipated   AVOID CONSTIPATION.  High fiber and water intake usually takes care of this.  Sometimes a laxative is needed to stimulate more frequent bowel movements, but    Laxatives are not a good long-term solution as it can wear the colon out. o Osmotics (Milk of Magnesia, Fleets phosphosoda, Magnesium citrate, MiraLax, GoLytely) are safer than  o Stimulants (Senokot, Castor Oil, Dulcolax, Ex Lax)    o Do not take laxatives for more than 7days in a row.    IF SEVERELY CONSTIPATED, try a Bowel Retraining Program: o Do not use laxatives.  o Eat a diet high in roughage, such as bran cereals and leafy vegetables.  o Drink six (6) ounces of prune or apricot juice each morning.  o Eat two (2) large servings of stewed fruit each day.  o Take one (1) heaping tablespoon of a psyllium-based bulking agent twice a day. Use sugar-free sweetener when possible to avoid excessive calories.  o Eat a normal breakfast.  o Set aside 15 minutes after breakfast to sit on the toilet, but do not strain to have a bowel movement.  o  If you do not have a bowel movement by the third day, use an enema and repeat the above steps.    Controlling diarrhea o Switch to liquids and simpler foods for a few days to avoid stressing your intestines further. o Avoid dairy products (especially milk & ice cream) for a short time.  The intestines often can lose the ability to digest lactose when stressed. o Avoid foods that cause gassiness or bloating.  Typical foods include beans and other legumes, cabbage, broccoli, and dairy foods.  Every person has some sensitivity to other foods, so listen to our body and avoid those foods that trigger problems for you. o Adding fiber (Citrucel, Metamucil, psyllium, Miralax) gradually can help thicken stools by absorbing excess fluid and retrain the intestines to act more normally.  Slowly increase the dose over a few weeks.  Too much fiber too  soon can backfire and cause cramping & bloating. o Probiotics (such as active yogurt, Align, etc) may help repopulate the intestines and colon with normal bacteria and calm down a sensitive digestive tract.  Most studies show it to be of mild help, though, and such products can be costly. o Medicines:   Bismuth subsalicylate (ex. Kayopectate, Pepto Bismol) every 30 minutes for up to 6 doses can help control diarrhea.  Avoid if pregnant.   Loperamide (Immodium) can slow down diarrhea.  Start with two tablets (4mg  total) first and then try one tablet every 6 hours.  Avoid if you are having fevers or severe pain.  If you are not better or start feeling worse, stop all medicines and call your doctor for advice o Call your doctor if you are getting worse or not better.  Sometimes further testing (cultures, endoscopy, X-ray studies, bloodwork, etc) may be needed to help diagnose and treat the cause of the diarrhea. o   WHAT CAN BE DONE IF THE FISSURE DOES NOT HEAL? A fissure that fails to respond to conservative measures should be re-examined. Persistent hard or loose bowel movements, scarring, or spasm of the internal anal muscle all contribute to delayed healing. Other medical problems such as inflammatory bowel disease (Crohn's disease), infections, or anal tumors can cause symptoms similar to anal fissures. Patients suffering from persistent anal pain should be examined to exclude these symptoms. This may include a colonoscopy or an exam in the operating room under anesthesia.  WHAT DOES SURGERY INVOLVE? Surgical options for treating anal fissure include Botulinum toxin (Botox) injection into the anal sphincter and surgical division of a portion of the internal anal sphincter (lateral internal sphincterotomy). Both of these are performed typically as outpatient, same-day procedures, or occasionally in the office setting. The goal of these surgical options is to promote relaxation of the anal sphincter,  thereby decreasing anal pain and spasm, allowing the fissure to heal. Botox injection results in healing in 50-80% of patients, while sphincterotomy is reported to be over 90% successful. If a sentinel pile is present, it may be removed to promote healing of the fissure. All surgical procedures carry some risk, and a sphincterotomy can rarely interfere with one's ability to control gas and stool. Your colon and rectal surgeon will discuss these risks with you to determine the appropriate treatment for your particular situation.  HOW LONG IS THE RECOVERY AFTER SURGERY? It is important to note that complete healing with both medical and surgical treatments can take up to approximately 6-10 weeks. However, acute pain after surgery often disappears after a few days. Most patients will  be able to return to work and resume daily activities in a few short days after the surgery.  CAN FISSURES LEAD TO COLON CANCER? Absolutely not. Persistent symptoms, however, need careful evaluation since other conditions other than an anal fissure can cause similar symptoms. Your colon and rectal surgeon may request additional tests, even if your fissure has successfully healed. A colonoscopy may be required to exclude other causes of rectal bleeding.  GETTING TO GOOD BOWEL HEALTH. Irregular bowel habits such as constipation and diarrhea can lead to many problems over time.  Having one soft bowel movement a day is the most important way to prevent further problems.  The anorectal canal is designed to handle stretching and feces to safely manage our ability to get rid of solid waste (feces, poop, stool) out of our body.  BUT, hard constipated stools can act like ripping concrete bricks and diarrhea can be a burning fire to this very sensitive area of our body, causing inflamed hemorrhoids, anal fissures, increasing risk is perirectal abscesses, abdominal pain/bloating, an making irritable bowel worse.     The goal: ONE SOFT  BOWEL MOVEMENT A DAY!  To have soft, regular bowel movements:    Drink at least 8 tall glasses of water a day.     Take plenty of fiber.  Fiber is the undigested part of plant food that passes into the colon, acting s "natures broom" to encourage bowel motility and movement.  Fiber can absorb and hold large amounts of water. This results in a larger, bulkier stool, which is soft and easier to pass. Work gradually over several weeks up to 6 servings a day of fiber (25g a day even more if needed) in the form of: o Vegetables -- Root (potatoes, carrots, turnips), leafy green (lettuce, salad greens, celery, spinach), or cooked high residue (cabbage, broccoli, etc) o Fruit -- Fresh (unpeeled skin & pulp), Dried (prunes, apricots, cherries, etc ),  or stewed ( applesauce)  o Whole grain breads, pasta, etc (whole wheat)  o Bran cereals    Bulking Agents -- This type of water-retaining fiber generally is easily obtained each day by one of the following:  o Psyllium bran -- The psyllium plant is remarkable because its ground seeds can retain so much water. This product is available as Metamucil, Konsyl, Effersyllium, Per Diem Fiber, or the less expensive generic preparation in drug and health food stores. Although labeled a laxative, it really is not a laxative.  o Methylcellulose -- This is another fiber derived from wood which also retains water. It is available as Citrucel. o Polyethylene Glycol - and "artificial" fiber commonly called Miralax or Glycolax.  It is helpful for people with gassy or bloated feelings with regular fiber o Flax Seed - a less gassy fiber than psyllium   No reading or other relaxing activity while on the toilet. If bowel movements take longer than 5 minutes, you are too constipated   AVOID CONSTIPATION.  High fiber and water intake usually takes care of this.  Sometimes a laxative is needed to stimulate more frequent bowel movements, but    Laxatives are not a good long-term solution  as it can wear the colon out. o Osmotics (Milk of Magnesia, Fleets phosphosoda, Magnesium citrate, MiraLax, GoLytely) are safer than  o Stimulants (Senokot, Castor Oil, Dulcolax, Ex Lax)    o Do not take laxatives for more than 7days in a row.    IF SEVERELY CONSTIPATED, try a Bowel Retraining Program: o Do  not use laxatives.  o Eat a diet high in roughage, such as bran cereals and leafy vegetables.  o Drink six (6) ounces of prune or apricot juice each morning.  o Eat two (2) large servings of stewed fruit each day.  o Take one (1) heaping tablespoon of a psyllium-based bulking agent twice a day. Use sugar-free sweetener when possible to avoid excessive calories.  o Eat a normal breakfast.  o Set aside 15 minutes after breakfast to sit on the toilet, but do not strain to have a bowel movement.  o If you do not have a bowel movement by the third day, use an enema and repeat the above steps.    Controlling diarrhea o Switch to liquids and simpler foods for a few days to avoid stressing your intestines further. o Avoid dairy products (especially milk & ice cream) for a short time.  The intestines often can lose the ability to digest lactose when stressed. o Avoid foods that cause gassiness or bloating.  Typical foods include beans and other legumes, cabbage, broccoli, and dairy foods.  Every person has some sensitivity to other foods, so listen to our body and avoid those foods that trigger problems for you. o Adding fiber (Citrucel, Metamucil, psyllium, Miralax) gradually can help thicken stools by absorbing excess fluid and retrain the intestines to act more normally.  Slowly increase the dose over a few weeks.  Too much fiber too soon can backfire and cause cramping & bloating. o Probiotics (such as active yogurt, Align, etc) may help repopulate the intestines and colon with normal bacteria and calm down a sensitive digestive tract.  Most studies show it to be of mild help, though, and such  products can be costly. o Medicines:   Bismuth subsalicylate (ex. Kayopectate, Pepto Bismol) every 30 minutes for up to 6 doses can help control diarrhea.  Avoid if pregnant.   Loperamide (Immodium) can slow down diarrhea.  Start with two tablets (4mg  total) first and then try one tablet every 6 hours.  Avoid if you are having fevers or severe pain.  If you are not better or start feeling worse, stop all medicines and call your doctor for advice o Call your doctor if you are getting worse or not better.  Sometimes further testing (cultures, endoscopy, X-ray studies, bloodwork, etc) may be needed to help diagnose and treat the cause of the diarrhea.  ANORECTAL SURGERY: POST OP INSTRUCTIONS  1. Take your usually prescribed home medications unless otherwise directed. 2. DIET: Follow a light bland diet the first 24 hours after arrival home, such as soup, liquids, crackers, etc.  Be sure to include lots of fluids daily.  Avoid fast food or heavy meals as your are more likely to get nauseated.  Eat a low fat the next few days after surgery.   3. PAIN CONTROL: a. Pain is best controlled by a usual combination of three different methods TOGETHER: i. Ice/Heat ii. Over the counter pain medication iii. Prescription pain medication b. Most patients will experience some swelling and discomfort in the anus/rectal area. and incisions.  Ice packs or heat (30-60 minutes up to 6 times a day) will help. Use ice for the first few days to help decrease swelling and bruising, then switch to heat such as warm towels, sitz baths, warm baths, etc to help relax tight/sore spots and speed recovery.  Some people prefer to use ice alone, heat alone, alternating between ice & heat.  Experiment to what works for  you.  Swelling and bruising can take several weeks to resolve.   c. It is helpful to take an over-the-counter pain medication regularly for the first few weeks.  Choose one of the following that works best for  you: i. Naproxen (Aleve, etc)  Two 220mg  tabs twice a day ii. Ibuprofen (Advil, etc) Three 200mg  tabs four times a day (every meal & bedtime) iii. Acetaminophen (Tylenol, etc) 500-650mg  four times a day (every meal & bedtime) d. A  prescription for pain medication (such as oxycodone, hydrocodone, etc) should be given to you upon discharge.  Take your pain medication as prescribed.  i. If you are having problems/concerns with the prescription medicine (does not control pain, nausea, vomiting, rash, itching, etc), please call us [phone] to see if we need to switch you to a different pain medicine that will work better for you and/or control your side effect better. ii. If you need a refill on your pain medication, please contact your pharmacy.  They will contact our office to request authorization. Prescriptions will not be filled after 5 pm or on week-ends.  Use a Sitz Bath 4-8 times a day for relief A sitz bath is a warm water bath taken in the sitting position that covers only the hips and buttocks. It may be used for either healing or hygiene purposes. Sitz baths are also used to relieve pain, itching, or muscle spasms. The water may contain medicine. Moist heat will help you heal and relax.  HOME CARE INSTRUCTIONS  Take 3 to 4 sitz baths a day. 1. Fill the bathtub half full with warm water. 2. Sit in the water and open the drain a little. 3. Turn on the warm water to keep the tub half full. Keep the water running constantly. 4. Soak in the water for 15 to 20 minutes. 5. After the sitz bath, pat the affected area dry first. SEEK MEDICAL CARE IF:  You get worse instead of better. Stop the sitz baths if you get worse.   4. KEEP YOUR BOWELS REGULAR a. The goal is one bowel movement a day b. Avoid getting constipated.  Between the surgery and the pain medications, it is common to experience some constipation.  Increasing fluid intake and taking a fiber supplement (such as Metamucil,  Citrucel, FiberCon, MiraLax, etc) 1-2 times a day regularly will usually help prevent this problem from occurring.  A mild laxative (prune juice, Milk of Magnesia, MiraLax, etc) should be taken according to package directions if there are no bowel movements after 48 hours. c. Watch out for diarrhea.  If you have many loose bowel movements, simplify your diet to bland foods & liquids for a few days.  Stop any stool softeners and decrease your fiber supplement.  Switching to mild anti-diarrheal medications (Kayopectate, Pepto Bismol) can help.  If this worsens or does not improve, please call us.  5. Wound Care a. Remove your bandages the day after surgery.  Unless discharge instructions indicate otherwise, leave your bandage dry and in place overnight.  Remove the bandage during your first bowel movement.   b. Allow the wound packing to fall out over the next few days.  You can trim exposed gauze / ribbon as it falls out.  You do not need to repack the wound unless instructed otherwise.  Wear an absorbent pad or soft cotton gauze in your underwear as needed to catch any drainage and help keep the area  c. Keep the area clean and dry.  Bathe / shower every day.  Keep the area clean by showering / bathing over the incision / wound.   It is okay to soak an open wound to help wash it.  Wet wipes or showers / gentle washing after bowel movements is often less traumatic than regular toilet paper. d. Bonita Quin may have some styrofoam-like soft packing in the rectum which will come out with the first bowel movement.  e. You will often notice bleeding with bowel movements.  This should slow down by the end of the first week of surgery f. Expect some drainage.  This should slow down, too, by the end of the first week of surgery.  Wear an absorbent pad or soft cotton gauze in your underwear until the drainage stops. 6. ACTIVITIES as tolerated:   a. You may resume regular (light) daily activities beginning the next day-such  as daily self-care, walking, climbing stairs-gradually increasing activities as tolerated.  If you can walk 30 minutes without difficulty, it is safe to try more intense activity such as jogging, treadmill, bicycling, low-impact aerobics, swimming, etc. b. Save the most intensive and strenuous activity for last such as sit-ups, heavy lifting, contact sports, etc  Refrain from any heavy lifting or straining until you are off narcotics for pain control.   c. DO NOT PUSH THROUGH PAIN.  Let pain be your guide: If it hurts to do something, don't do it.  Pain is your body warning you to avoid that activity for another week until the pain goes down. d. You may drive when you are no longer taking prescription pain medication, you can comfortably sit for long periods of time, and you can safely maneuver your car and apply brakes. e. Bonita Quin may have sexual intercourse when it is comfortable.  7. FOLLOW UP in our office a. Please call [facility] at [phone] to set up an appointment to see your surgeon in the office for a follow-up appointment approximately 2 weeks after your surgery. b. Make sure that you call for this appointment the day you arrive home to insure a convenient appointment time. 10. IF YOU HAVE DISABILITY OR FAMILY LEAVE FORMS, BRING THEM TO THE OFFICE FOR PROCESSING.  DO NOT GIVE THEM TO YOUR DOCTOR.        WHEN TO CALL us [phone]: 1. Poor pain control 2. Reactions / problems with new medications (rash/itching, nausea, etc)  3. Fever over 101.5 F (38.5 C) 4. Inability to urinate 5. Nausea and/or vomiting 6. Worsening swelling or bruising 7. Continued bleeding from incision. 8. Increased pain, redness, or drainage from the incision  The clinic staff is available to answer your questions during regular business hours (8:30am-5pm).  Please don't hesitate to call and ask to speak to one of our nurses for clinical concerns.   A surgeon from [facility] is always on call at  the hospitals   If you have a medical emergency, go to the nearest emergency room or call 911.    [facility] [address], [address], [place], [place]  [address] ? MAIN: [phone] ? TOLL FREE: [phone] ? FAX [phone] [email or web address]

## 2013-11-20 ENCOUNTER — Encounter (INDEPENDENT_AMBULATORY_CARE_PROVIDER_SITE_OTHER): Payer: BC Managed Care – PPO | Admitting: Surgery

## 2013-12-04 ENCOUNTER — Encounter (INDEPENDENT_AMBULATORY_CARE_PROVIDER_SITE_OTHER): Payer: Self-pay | Admitting: Surgery

## 2013-12-04 ENCOUNTER — Ambulatory Visit (INDEPENDENT_AMBULATORY_CARE_PROVIDER_SITE_OTHER): Payer: BC Managed Care – PPO | Admitting: Surgery

## 2013-12-04 ENCOUNTER — Encounter (INDEPENDENT_AMBULATORY_CARE_PROVIDER_SITE_OTHER): Payer: Self-pay

## 2013-12-04 VITALS — BP 122/80 | HR 80 | Temp 98.4°F | Resp 20 | Ht 76.0 in | Wt 168.0 lb

## 2013-12-04 DIAGNOSIS — K604 Rectal fistula, unspecified: Secondary | ICD-10-CM

## 2013-12-04 DIAGNOSIS — K602 Anal fissure, unspecified: Secondary | ICD-10-CM

## 2013-12-04 DIAGNOSIS — K603 Anal fistula, unspecified: Secondary | ICD-10-CM

## 2013-12-04 HISTORY — DX: Rectal fistula, unspecified: K60.40

## 2013-12-04 HISTORY — DX: Rectal fistula: K60.4

## 2013-12-04 NOTE — Patient Instructions (Signed)
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  Also possibly relax anal sphincter to allow anal fissure to heal  See the Handout(s) we have given you.  Please call our office at [phone] if you wish to schedule surgery or if you have further questions / concerns.   ANORECTAL SURGERY: POST OP INSTRUCTIONS  1. Take your usually prescribed home medications unless otherwise directed. 2. DIET: Follow a light bland diet the first 24 hours after arrival home, such as soup, liquids, crackers, etc.  Be sure to include lots of fluids daily.  Avoid fast food or heavy meals as your are more likely to get nauseated.  Eat a low fat the next few days after surgery.   3. PAIN CONTROL: a. Pain is best controlled by a usual combination of three different methods TOGETHER: i. Ice/Heat ii. Over the counter pain medication iii. Prescription pain medication b. Most patients will experience some swelling and discomfort in the anus/rectal area. and incisions.  Ice packs or heat (30-60 minutes up to 6 times a day) will help. Use ice for the first few days to help decrease swelling and bruising, then switch to heat such as warm towels, sitz baths, warm baths, etc to help relax tight/sore spots and speed recovery.  Some people prefer to use ice alone, heat alone, alternating between ice & heat.  Experiment to what works for you.  Swelling and bruising can take several weeks to resolve.   c. It is helpful to take an over-the-counter pain medication regularly for the first few weeks.  Choose one of the following that works best for you: i. Naproxen (Aleve, etc)  Two 220mg  tabs twice a day ii. Ibuprofen (Advil, etc) Three 200mg  tabs four times a day (every meal & bedtime) iii. Acetaminophen (Tylenol, etc) 500-650mg  four times a day (every meal & bedtime) d. A  prescription for pain medication (such as oxycodone, hydrocodone, etc)  should be given to you upon discharge.  Take your pain medication as prescribed.  i. If you are having problems/concerns with the prescription medicine (does not control pain, nausea, vomiting, rash, itching, etc), please call us [phone] to see if we need to switch you to a different pain medicine that will work better for you and/or control your side effect better. ii. If you need a refill on your pain medication, please contact your pharmacy.  They will contact our office to request authorization. Prescriptions will not be filled after 5 pm or on week-ends.  Use a Sitz Bath 4-8 times a day for relief A sitz bath is a warm water bath taken in the sitting position that covers only the hips and buttocks. It may be used for either healing or hygiene purposes. Sitz baths are also used to relieve pain, itching, or muscle spasms. The water may contain medicine. Moist heat will help you heal and relax.  HOME CARE INSTRUCTIONS  Take 3 to 4 sitz baths a day. 1. Fill the bathtub half full with warm water. 2. Sit in the water and open the drain a little. 3. Turn on the warm water to keep the tub half full. Keep the water running constantly. 4. Soak in the water for 15 to 20 minutes. 5. After the sitz bath, pat the affected area dry first. SEEK MEDICAL CARE IF:  You get worse instead of better. Stop the sitz baths if you get worse.   4. KEEP YOUR  BOWELS REGULAR a. The goal is one bowel movement a day b. Avoid getting constipated.  Between the surgery and the pain medications, it is common to experience some constipation.  Increasing fluid intake and taking a fiber supplement (such as Metamucil, Citrucel, FiberCon, MiraLax, etc) 1-2 times a day regularly will usually help prevent this problem from occurring.  A mild laxative (prune juice, Milk of Magnesia, MiraLax, etc) should be taken according to package directions if there are no bowel movements after 48 hours. c. Watch out for diarrhea.  If you  have many loose bowel movements, simplify your diet to bland foods & liquids for a few days.  Stop any stool softeners and decrease your fiber supplement.  Switching to mild anti-diarrheal medications (Kayopectate, Pepto Bismol) can help.  If this worsens or does not improve, please call us.  5. Wound Care a. Remove your bandages the day after surgery.  Unless discharge instructions indicate otherwise, leave your bandage dry and in place overnight.  Remove the bandage during your first bowel movement.   b. Allow the wound packing to fall out over the next few days.  You can trim exposed gauze / ribbon as it falls out.  You do not need to repack the wound unless instructed otherwise.  Wear an absorbent pad or soft cotton gauze in your underwear as needed to catch any drainage and help keep the area  c. Keep the area clean and dry.  Bathe / shower every day.  Keep the area clean by showering / bathing over the incision / wound.   It is okay to soak an open wound to help wash it.  Wet wipes or showers / gentle washing after bowel movements is often less traumatic than regular toilet paper. d. Bonita Quin may have some styrofoam-like soft packing in the rectum which will come out with the first bowel movement.  e. You will often notice bleeding with bowel movements.  This should slow down by the end of the first week of surgery f. Expect some drainage.  This should slow down, too, by the end of the first week of surgery.  Wear an absorbent pad or soft cotton gauze in your underwear until the drainage stops. 6. ACTIVITIES as tolerated:   a. You may resume regular (light) daily activities beginning the next day-such as daily self-care, walking, climbing stairs-gradually increasing activities as tolerated.  If you can walk 30 minutes without difficulty, it is safe to try more intense activity such as jogging, treadmill, bicycling, low-impact aerobics, swimming, etc. b. Save the most intensive and strenuous activity for  last such as sit-ups, heavy lifting, contact sports, etc  Refrain from any heavy lifting or straining until you are off narcotics for pain control.   c. DO NOT PUSH THROUGH PAIN.  Let pain be your guide: If it hurts to do something, don't do it.  Pain is your body warning you to avoid that activity for another week until the pain goes down. d. You may drive when you are no longer taking prescription pain medication, you can comfortably sit for long periods of time, and you can safely maneuver your car and apply brakes. e. Bonita Quin may have sexual intercourse when it is comfortable.  7. FOLLOW UP in our office a. Please call [facility] at [phone] to set up an appointment to see your surgeon in the office for a follow-up appointment approximately 2 weeks after your surgery. b. Make sure that you call for this appointment the  day you arrive home to insure a convenient appointment time. 10. IF YOU HAVE DISABILITY OR FAMILY LEAVE FORMS, BRING THEM TO THE OFFICE FOR PROCESSING.  DO NOT GIVE THEM TO YOUR DOCTOR.        WHEN TO CALL us [phone]: 1. Poor pain control 2. Reactions / problems with new medications (rash/itching, nausea, etc)  3. Fever over 101.5 F (38.5 C) 4. Inability to urinate 5. Nausea and/or vomiting 6. Worsening swelling or bruising 7. Continued bleeding from incision. 8. Increased pain, redness, or drainage from the incision  The clinic staff is available to answer your questions during regular business hours (8:30am-5pm).  Please don't hesitate to call and ask to speak to one of our nurses for clinical concerns.   A surgeon from [facility] is always on call at the hospitals   If you have a medical emergency, go to the nearest emergency room or call 911.    [facility] [address], [address], [place], [place]  [address] ? MAIN: [phone] ? TOLL FREE: [phone] ? FAX [phone] [email or web address]   Anal  Fistula An anal fistula is an abnormal tunnel that develops between the bowel and skin near the outside of the anus, where feces comes out. The anus has a number of tiny glands that make lubricating fluid. Sometimes these glands can become plugged and infected. This may lead to the development of a fluid-filled pocket (abscess). An anal fistula often develops after this infection or abscess. It is nearly always caused by a past or current anal abscess.  CAUSES  Though an anal fistula is almost always caused by a past or current anal abscess, other causes can include:  A complication of surgery.  Trauma to the rectal area.  Radiation to the area.  Other medical conditions or diseases, such as:   Chronic inflammatory bowel disease, such as Crohn disease or ulcerative colitis.   Colon or rectal cancer.   Diverticular disease, such as diverticulitis.   A sexually transmitted disease, such as gonorrhea, chlamydia, or syphilis.  An HIV infection or AIDS.  SYMPTOMS   Throbbing or constant pain that may be worse when sitting.   Swelling or irritation around the anus.   Drainage of pus or blood from an opening near the anus.   Pain with bowel movements.  Fever or chills. DIAGNOSIS  Your caregiver will examine the area to find the openings of the anal fistula and the fistula tract. The external opening of the anal fistula may be seen during a physical examination. Other examinations that may be performed include:   Examination of the rectal area with a gloved hand (digital rectal exam).   Examination with a probe or scope to help locate the internal opening of the fistula.   Injection of a dye into the fistula opening. X-rays can be taken to find the exact location and path of the fistula.   An MRI or ultrasound of the anal area.  Other tests may be performed to find the cause of the anal fistula.   TREATMENT  The most common treatment for an anal fistula is surgery.  There are different surgery options depending on where your fistula is located and how complex the fistula is. Surgical options include:  A fistulotomy. This surgery involves opening up the whole fistula and draining the contents inside to promote healing.  Seton placement. A silk string (seton) is placed into the fistula during a fistulotomy to drain  any infection to promote healing.  Advancement flap procedure. Tissue is removed from your rectum or the skin around the anus and is attached to the opening of the fistula.  Bioprosthetic plug. A cone-shaped plug is made from your tissue and is used to block the opening of the fistula. Some anal fistulas do not require surgery. A fibrin glue is a non-surgical option that involves injecting the glue to seal the fistula. You also may be prescribed an antibiotic medicine to treat an infection.  HOME CARE INSTRUCTIONS   Take your antibiotics as directed. Finish them even if you start to feel better.  Only take over-the-counter or prescription medicines as directed by your caregiver.Use a stool softener or laxative, if recommended.   Eat a high-fiber diet to help avoid constipation or as directed by your caregiver.  Drink enough water to keep your urine clear or pale yellow.   A warm sitz bath may be soothing and help with healing. You may take warm sitz baths for 15 20 minutes, 3 4 times a day to ease pain and discomfort.   Follow excellent hygiene to keep the anal area as clean and dry as possible. Use wet toilet paper or moist towelettes after each bowel movement.  SEEK MEDICAL CARE IF: You have increased pain not controlled with medicines.  SEEK IMMEDIATE MEDICAL CARE IF:  You have severe, intolerable pain.  You have new swelling, redness, or discharge around the anal area.  You have tenderness or warmth around the anal area.  You have chills or diarrhea.  You have severe problems urinating or having a bowel movement.   You  have a fever or persistent symptoms for more than 2 3 days.   You have a fever and your symptoms suddenly get worse.  MAKE SURE YOU:   Understand these instructions.  Will watch your condition.  Will get help right away if you are not doing well or get worse. Document Released: 09/01/2008 Document Revised: 09/05/2012 Document Reviewed: 07/25/2011 [facility] Patient Information 2014 [place], [place].   WHAT IS AN ANAL FISSURE? An anal fissure (fissure-in-ano) is a small, oval shaped tear in skin that lines the opening of the anus. Fissures typically cause severe pain and bleeding with bowel movements. Fissures are quite common in the general population, but are often confused with other causes of pain and bleeding, such as hemorrhoids.  WHAT ARE THE SYMPTOMS OF AN ANAL FISSURE? The typical symptoms of an anal fissure include severe pain during, and especially after, a bowel movement, lasting from several minutes to a few hours. Patients may also notice bright red blood from the anus that can be seen on the toilet paper or on the stool. Between bowel movements, patients with anal fissures are often relatively symptom-free. Many patients are fearful of having a bowel movement and may try to avoid defecation secondary to the pain.   WHAT CAUSES AN ANAL FISSURE? Fissures are usually caused by trauma to the inner lining of the anus. Patients with tight anal sphincter muscles (i.e., increased muscle tone) are more prone to developing anal fissures. A hard, dry bowel movement is typically responsible, but loose stools and diarrhea can also be the cause. Following a bowel movement, severe anal pain can produce spasm of the anal sphincter muscle, resulting in a decrease in blood flow to the site of the injury, thus impairing healing of the wound. The next bowel movement results in more pain, anal spasm, decreased blood flow to the area, and the  cycle continues. Treatments are aimed at interrupting this  cycle by relaxing the anal sphincter muscle to promote healing of the fissure.   Other, less common, causes include inflammatory conditions and certain anal infections or tumors. Anal fissures may be acute (recent onset) or chronic (present for a long period of time). Chronic fissures may be more difficult to treat, and may also have an external lump associated with the tear, called a sentinel pile or skin tag, as well as extra tissue just inside the anal canal (hypertrophied papilla) .  WHAT IS THE TREATMENT OF ANAL FISSURES? The majority of anal fissures do not require surgery. The most common treatment for an acute anal fissure consists of making the stool more formed and bulky with a diet high in fiber and utilization of over-the-counter fiber supplementation (totaling 25-35 grams of fiber/day). Stool softeners and increasing water intake may be necessary to promote soft bowel movements and aid in the healing process. Topical anesthetics for pain and warm tub baths (sitz baths) for 10-20 minutes several times a day (especially after bowel movements) are soothing and promote relaxation of the anal muscles, which may help the healing process.  Other medications (such as diltiazem) may be prescribed that allow relaxation of the anal sphincter muscles. Your surgeon will go over benefits and side-effects of each of these with you. Narcotic pain medications are not recommended for anal fissures, as they promote constipation. Chronic fissures are generally more difficult to treat, and your surgeon may advise surgical treatment.  WILL THE PROBLEM RETURN? Fissures can recur easily, and it is quite common for a fully healed fissure to recur after a hard bowel movement or other trauma. Even when the pain and bleeding have subsided, it is very important to continue good bowel habits and a diet high in fiber as a lifestyle change. If the problem returns without an obvious cause, further assessment is  warranted.  GETTING TO GOOD BOWEL HEALTH. Irregular bowel habits such as constipation and diarrhea can lead to many problems over time.  Having one soft bowel movement a day is the most important way to prevent further problems.  The anorectal canal is designed to handle stretching and feces to safely manage our ability to get rid of solid waste (feces, poop, stool) out of our body.  BUT, hard constipated stools can act like ripping concrete bricks and diarrhea can be a burning fire to this very sensitive area of our body, causing inflamed hemorrhoids, anal fissures, increasing risk is perirectal abscesses, abdominal pain/bloating, an making irritable bowel worse.     The goal: ONE SOFT BOWEL MOVEMENT A DAY!  To have soft, regular bowel movements:    Drink at least 8 tall glasses of water a day.     Take plenty of fiber.  Fiber is the undigested part of plant food that passes into the colon, acting s "natures broom" to encourage bowel motility and movement.  Fiber can absorb and hold large amounts of water. This results in a larger, bulkier stool, which is soft and easier to pass. Work gradually over several weeks up to 6 servings a day of fiber (25g a day even more if needed) in the form of: o Vegetables -- Root (potatoes, carrots, turnips), leafy green (lettuce, salad greens, celery, spinach), or cooked high residue (cabbage, broccoli, etc) o Fruit -- Fresh (unpeeled skin & pulp), Dried (prunes, apricots, cherries, etc ),  or stewed ( applesauce)  o Whole grain breads, pasta, etc (whole wheat)  o Bran cereals    Bulking Agents -- This type of water-retaining fiber generally is easily obtained each day by one of the following:  o Psyllium bran -- The psyllium plant is remarkable because its ground seeds can retain so much water. This product is available as Metamucil, Konsyl, Effersyllium, Per Diem Fiber, or the less expensive generic preparation in drug and health food stores. Although labeled a  laxative, it really is not a laxative.  o Methylcellulose -- This is another fiber derived from wood which also retains water. It is available as Citrucel. o Polyethylene Glycol - and "artificial" fiber commonly called Miralax or Glycolax.  It is helpful for people with gassy or bloated feelings with regular fiber o Flax Seed - a less gassy fiber than psyllium   No reading or other relaxing activity while on the toilet. If bowel movements take longer than 5 minutes, you are too constipated   AVOID CONSTIPATION.  High fiber and water intake usually takes care of this.  Sometimes a laxative is needed to stimulate more frequent bowel movements, but    Laxatives are not a good long-term solution as it can wear the colon out. o Osmotics (Milk of Magnesia, Fleets phosphosoda, Magnesium citrate, MiraLax, GoLytely) are safer than  o Stimulants (Senokot, Castor Oil, Dulcolax, Ex Lax)    o Do not take laxatives for more than 7days in a row.    IF SEVERELY CONSTIPATED, try a Bowel Retraining Program: o Do not use laxatives.  o Eat a diet high in roughage, such as bran cereals and leafy vegetables.  o Drink six (6) ounces of prune or apricot juice each morning.  o Eat two (2) large servings of stewed fruit each day.  o Take one (1) heaping tablespoon of a psyllium-based bulking agent twice a day. Use sugar-free sweetener when possible to avoid excessive calories.  o Eat a normal breakfast.  o Set aside 15 minutes after breakfast to sit on the toilet, but do not strain to have a bowel movement.  o If you do not have a bowel movement by the third day, use an enema and repeat the above steps.    Controlling diarrhea o Switch to liquids and simpler foods for a few days to avoid stressing your intestines further. o Avoid dairy products (especially milk & ice cream) for a short time.  The intestines often can lose the ability to digest lactose when stressed. o Avoid foods that cause gassiness or bloating.   Typical foods include beans and other legumes, cabbage, broccoli, and dairy foods.  Every person has some sensitivity to other foods, so listen to our body and avoid those foods that trigger problems for you. o Adding fiber (Citrucel, Metamucil, psyllium, Miralax) gradually can help thicken stools by absorbing excess fluid and retrain the intestines to act more normally.  Slowly increase the dose over a few weeks.  Too much fiber too soon can backfire and cause cramping & bloating. o Probiotics (such as active yogurt, Align, etc) may help repopulate the intestines and colon with normal bacteria and calm down a sensitive digestive tract.  Most studies show it to be of mild help, though, and such products can be costly. o Medicines:   Bismuth subsalicylate (ex. Kayopectate, Pepto Bismol) every 30 minutes for up to 6 doses can help control diarrhea.  Avoid if pregnant.   Loperamide (Immodium) can slow down diarrhea.  Start with two tablets (4mg  total) first and then try one tablet every  6 hours.  Avoid if you are having fevers or severe pain.  If you are not better or start feeling worse, stop all medicines and call your doctor for advice o Call your doctor if you are getting worse or not better.  Sometimes further testing (cultures, endoscopy, X-ray studies, bloodwork, etc) may be needed to help diagnose and treat the cause of the diarrhea. o   WHAT CAN BE DONE IF THE FISSURE DOES NOT HEAL? A fissure that fails to respond to conservative measures should be re-examined. Persistent hard or loose bowel movements, scarring, or spasm of the internal anal muscle all contribute to delayed healing. Other medical problems such as inflammatory bowel disease (Crohn's disease), infections, or anal tumors can cause symptoms similar to anal fissures. Patients suffering from persistent anal pain should be examined to exclude these symptoms. This may include a colonoscopy or an exam in the operating room under  anesthesia.  WHAT DOES SURGERY INVOLVE? Surgical options for treating anal fissure include Botulinum toxin (Botox) injection into the anal sphincter and surgical division of a portion of the internal anal sphincter (lateral internal sphincterotomy). Both of these are performed typically as outpatient, same-day procedures, or occasionally in the office setting. The goal of these surgical options is to promote relaxation of the anal sphincter, thereby decreasing anal pain and spasm, allowing the fissure to heal. Botox injection results in healing in 50-80% of patients, while sphincterotomy is reported to be over 90% successful. If a sentinel pile is present, it may be removed to promote healing of the fissure. All surgical procedures carry some risk, and a sphincterotomy can rarely interfere with one's ability to control gas and stool. Your colon and rectal surgeon will discuss these risks with you to determine the appropriate treatment for your particular situation.  HOW LONG IS THE RECOVERY AFTER SURGERY? It is important to note that complete healing with both medical and surgical treatments can take up to approximately 6-10 weeks. However, acute pain after surgery often disappears after a few days. Most patients will be able to return to work and resume daily activities in a few short days after the surgery.  CAN FISSURES LEAD TO COLON CANCER? Absolutely not. Persistent symptoms, however, need careful evaluation since other conditions other than an anal fissure can cause similar symptoms. Your colon and rectal surgeon may request additional tests, even if your fissure has successfully healed. A colonoscopy may be required to exclude other causes of rectal bleeding.

## 2013-12-04 NOTE — Progress Notes (Signed)
Subjective:     Patient ID: Billy Garcia, male   DOB: October 11, 1990, 23 y.o.   MRN: [id number]  HPI  Note: This dictation was prepared with Dragon/digital dictation along with [facility] technology. Any transcriptional errors that result from this process are unintentional.       Billy Garcia  21-Jul-1991 [id number]  Patient Care Team: Judith Part as Referring Physician (Emergency Medicine)  Procedure (Date: 10/18/2013):  Reason for visit: Pain at anus. Abscess & fissure   Procedure: Incision and drainage of perirectal abscess 10/18/2013  This patient returns for surgical re-evaluation.  The pain is less.  He used to diltiazem cream for a week and then ran out (?)  His bowels are moving okay.  No fevers or chills.  He still has drainage.  Her concerns and.  No new pains.  No blood.  Patient Active Problem List   Diagnosis Date Noted  . 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 Anal fissure 10/18/2013    Past Medical History  Diagnosis Date  . Perianal abscess, right posterior s/o I&D 10/18/2013 10/18/2013    Past Surgical History  Procedure Laterality Date  . Incision and drainage perirectal abscess  10/17/2013    History   Social History  . Marital Status: Single    Spouse Name: N/A    Number of Children: N/A  . Years of Education: N/A   Occupational History  . Not on file.   Social History Main Topics  . Smoking status: Never Smoker   . Smokeless tobacco: Never Used  . Alcohol Use: Yes     Comment: occasional  . Drug Use: No  . Sexual Activity: Not on file   Other Topics Concern  . Not on file   Social History Narrative  . No narrative on file    No family history on file.  No current outpatient prescriptions on file.   No current facility-administered medications for this visit.     No Known Allergies  BP 122/80  Pulse 80  Temp(Src) 98.4 F (36.9 C) (Oral)  Resp 20  Ht 6\' 4"  (1.93 m)  Wt 168 lb (76.204 kg)  BMI 20.46 kg/m2  No  results found.   Review of Systems  Constitutional: Negative for fever, chills and diaphoresis.  HENT: Negative for sore throat and trouble swallowing.   Eyes: Negative for photophobia and visual disturbance.  Respiratory: Negative for choking and shortness of breath.   Cardiovascular: Negative for chest pain and palpitations.  Gastrointestinal: Negative for nausea, vomiting, abdominal pain, diarrhea, constipation and abdominal distention.  Genitourinary: Negative for dysuria, urgency, difficulty urinating and testicular pain.  Musculoskeletal: Negative for arthralgias, gait problem, myalgias and neck pain.  Skin: Negative for color change and rash.  Neurological: Negative for dizziness, speech difficulty, weakness and numbness.  Hematological: Negative for adenopathy.  Psychiatric/Behavioral: Negative for hallucinations, confusion and agitation.       Objective:   Physical Exam  Constitutional: He is oriented to person, place, and time. He appears well-developed and well-nourished. No distress.  HENT:  Head: Normocephalic.  Mouth/Throat: Oropharynx is clear and moist. No oropharyngeal exudate.  Eyes: Conjunctivae and EOM are normal. Pupils are equal, round, and reactive to light. No scleral icterus.  Neck: Normal range of motion. No tracheal deviation present.  Cardiovascular: Normal rate, normal heart sounds and intact distal pulses.   Pulmonary/Chest: Effort normal. No respiratory distress.  Abdominal: Soft. He exhibits no distension. There is no tenderness. Hernia confirmed negative in the right  inguinal area and confirmed negative in the left inguinal area.  Incisions clean with normal healing ridges.  No hernias  Genitourinary: Rectal exam shows fissure.     Musculoskeletal: Normal range of motion. He exhibits no tenderness.  Neurological: He is alert and oriented to person, place, and time. No cranial nerve deficit. He exhibits normal muscle tone. Coordination normal.    Skin: Skin is warm and dry. No rash noted. He is not diaphoretic.  Psychiatric: He has a normal mood and affect. His behavior is normal.       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  Very suspicious for fistula.  Chronic anal fissure.  Less symptomatic.  Perhaps smaller.  Noncompliance with prior recommendations.     Plan:     I am skeptical that the wound will heal on its own.  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  He did have a painful lump/abscess there for weeks before he saw us.  I recommended examination under anesthesia with superficial fistulotomy vs. Left repair of deeper fistula if found.   I do not think it will heal otherwise.  I do not think he is active infected so he does not need any more antibiotics.  He really did not like the idea of an operation but understood that options are limited.   The anatomy & physiology of the anorectal region was discussed.  We discussed the pathophysiology of anorectal abscess and fistula.  Differential diagnosis was discussed.  Natural history progression was discussed.   I stressed the importance of a bowel regimen to have daily soft bowel movements to minimize progression of disease.     The patient's condition is not adequately controlled.  Non-operative treatment has not healed the fistula.  Therefore, I recommended examination under anaesthesia to confirm the diagnosis and treat the fistula.  I discussed techniques that may be required such as fistulotomy, ligation by LIFT technique, and/or seton placement.  Benefits & alternatives discussed.  I noted a good likelihood this will help address the problem, but sometimes repeat operations and prolonged healing times may occur.  Risks such as bleeding, pain, recurrence, reoperation, incontinence, heart attack, death, and other risks were discussed.      Educational handouts further explaining the pathology, treatment options, and bowel regimen  were given.  The patient expressed understanding & is thinking about surgery.  We will work to coordinate surgery for a mutually convenient time should he decide to proceed.  I suspect the fissure needs 3 weeks of diltiazem.  The prescription was for that.  It is less painful/symptomatic.  If he has significant increase finger down, could offer concurrent lateral internal sphincterotomy to make sure the fissure fully heals.  I did discuss with him on that as well:  The anatomy & physiology of the anorectal region was discussed.  The pathophysiology of anal fissure and differential diagnosis was discussed.  Natural history progression  was discussed.   I stressed the importance of a bowel regimen to have daily soft bowel movements to minimize progression of disease.     The patient's condition is not adequately controlled.  Non-operative treatment has not healed the fissure.  Therefore, I recommended examination under anesthesia for better examination to confirm the diagnosis and treat by lateral internal sphincterotomy to relax the spasm better & allow the fissure to heal.  Technique, benefits, alternatives were discussed.   I noted a good likelihood this will help address  the problem.  Risks such as bleeding, pain, incontinence, recurrence, heart attack, death, and other risks were discussed.    Educational handouts further explaining the pathology, treatment options, and bowel regimen were given as well.  The patient expressed understanding & wishes to think about with surgery.  I do not take he is wanting to make a decision today.  I gave him my card.  Again gave handouts.  I offered to refill the diltiazem cream but I do not know if he is going to use it.  He will let me know.

## 2013-12-12 ENCOUNTER — Other Ambulatory Visit (INDEPENDENT_AMBULATORY_CARE_PROVIDER_SITE_OTHER): Payer: Self-pay | Admitting: *Deleted

## 2013-12-12 ENCOUNTER — Other Ambulatory Visit (INDEPENDENT_AMBULATORY_CARE_PROVIDER_SITE_OTHER): Payer: Self-pay | Admitting: Surgery

## 2013-12-12 DIAGNOSIS — K603 Anal fistula: Secondary | ICD-10-CM

## 2013-12-12 DIAGNOSIS — K644 Residual hemorrhoidal skin tags: Secondary | ICD-10-CM

## 2013-12-12 MED ORDER — OXYCODONE HCL 5 MG PO TABS: 5.0000 mg | ORAL_TABLET | ORAL | Status: DC | PRN

## 2013-12-17 ENCOUNTER — Telehealth (INDEPENDENT_AMBULATORY_CARE_PROVIDER_SITE_OTHER): Payer: Self-pay

## 2013-12-17 NOTE — Telephone Encounter (Signed)
LMOM for pt to call me back so I can see how he is doing after having surgery and give path results.

## 2013-12-19 NOTE — Telephone Encounter (Signed)
LMOM again for pt to call me back to check on the pt and to give his path results.

## 2013-12-26 ENCOUNTER — Encounter (INDEPENDENT_AMBULATORY_CARE_PROVIDER_SITE_OTHER): Payer: Self-pay | Admitting: Surgery

## 2013-12-26 ENCOUNTER — Ambulatory Visit (INDEPENDENT_AMBULATORY_CARE_PROVIDER_SITE_OTHER): Payer: BC Managed Care – PPO | Admitting: Surgery

## 2013-12-26 VITALS — BP 110/84 | HR 72 | Temp 98.4°F | Resp 14 | Ht 77.0 in | Wt 178.8 lb

## 2013-12-26 DIAGNOSIS — K604 Rectal fistula: Secondary | ICD-10-CM

## 2013-12-26 DIAGNOSIS — K603 Anal fistula: Secondary | ICD-10-CM

## 2013-12-26 DIAGNOSIS — K602 Anal fissure, unspecified: Secondary | ICD-10-CM

## 2013-12-26 NOTE — Progress Notes (Signed)
Subjective:     Patient ID: Billy Garcia, male   DOB: 11/04/1990, 23 y.o.   MRN: [id number]  HPI   Note: This dictation was prepared with Dragon/digital dictation along with [facility] technology. Any transcriptional errors that result from this process are unintentional.       Billy Garcia  09-05-1991 [id number]  Patient Care Team: Judith Part as Referring Physician (Emergency Medicine)  Procedure (Date: 10/18/2013):  Reason for visit: Pain at anus. Abscess & fissure   Procedure: Incision and drainage of perirectal abscess 10/18/2013  Procedure 12/12/2013  Procedure: Examination under anesthesia.  Superficial fistulotomy.  External hemorrhoidectomy  This patient returns for surgical re-evaluation.  The pain is less.  Mild blood when he wipes.  Drainage coming down.  His bowels are moving okay.  No fevers or chills.    Patient Active Problem List   Diagnosis Date Noted  . 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 Anal fissure 10/18/2013    Past Medical History  Diagnosis Date  . Perianal abscess, right posterior s/o I&D 10/18/2013 10/18/2013    Past Surgical History  Procedure Laterality Date  . Incision and drainage perirectal abscess  10/17/2013    History   Social History  . Marital Status: Single    Spouse Name: N/A    Number of Children: N/A  . Years of Education: N/A   Occupational History  . Not on file.   Social History Main Topics  . Smoking status: Never Smoker   . Smokeless tobacco: Never Used  . Alcohol Use: Yes     Comment: occasional  . Drug Use: No  . Sexual Activity: Not on file   Other Topics Concern  . Not on file   Social History Narrative  . No narrative on file    History reviewed. No pertinent family history.  No current outpatient prescriptions on file.   No current facility-administered medications for this visit.     No Known Allergies  BP 110/84  Pulse 72  Temp(Src) 98.4 F (36.9 C)  Resp 14  Ht 6\' 5"   (1.956 m)  Wt 178 lb 12.8 oz (81.103 kg)  BMI 21.20 kg/m2  No results found.   Review of Systems  Constitutional: Negative for fever, chills and diaphoresis.  HENT: Negative for sore throat and trouble swallowing.   Eyes: Negative for photophobia and visual disturbance.  Respiratory: Negative for choking and shortness of breath.   Cardiovascular: Negative for chest pain and palpitations.  Gastrointestinal: Negative for nausea, vomiting, abdominal pain, diarrhea, constipation and abdominal distention.  Genitourinary: Negative for dysuria, urgency, difficulty urinating and testicular pain.  Musculoskeletal: Negative for arthralgias, gait problem, myalgias and neck pain.  Skin: Negative for color change and rash.  Neurological: Negative for dizziness, speech difficulty, weakness and numbness.  Hematological: Negative for adenopathy.  Psychiatric/Behavioral: Negative for hallucinations, confusion and agitation.       Objective:   Physical Exam  Constitutional: He is oriented to person, place, and time. He appears well-developed and well-nourished. No distress.  HENT:  Head: Normocephalic.  Mouth/Throat: Oropharynx is clear and moist. No oropharyngeal exudate.  Eyes: Conjunctivae and EOM are normal. Pupils are equal, round, and reactive to light. No scleral icterus.  Neck: Normal range of motion. No tracheal deviation present.  Cardiovascular: Normal rate, normal heart sounds and intact distal pulses.   Pulmonary/Chest: Effort normal. No respiratory distress.  Abdominal: Soft. He exhibits no distension. There is no tenderness. Hernia confirmed negative in the right inguinal  area and confirmed negative in the left inguinal area.  Incisions clean with normal healing ridges.  No hernias  Genitourinary: Rectal exam shows fissure.     Musculoskeletal: Normal range of motion. He exhibits no tenderness.  Neurological: He is alert and oriented to person, place, and time. No cranial nerve  deficit. He exhibits normal muscle tone. Coordination normal.  Skin: Skin is warm and dry. No rash noted. He is not diaphoretic.  Psychiatric: He has a normal mood and affect. His behavior is normal.       Assessment:     Healing status post superficial fistulotomy of perianal fistula    Plan:     Continue baths/ showers every day.  Keep the area clean and dry.  Dry powder/cottonball/gauze as tolerated.  This should gradually epithelialize over and heal over the next few months  Increase activity as tolerated to regular activity.  Low impact exercise such as walking an hour a day at least ideal.  Do not push through pain.  Diet as tolerated.  Low fat high fiber diet ideal.  Bowel regimen with 30 g fiber a day and fiber supplement as needed to avoid problems.  Return to clinic q3-4 weeks until healed.   Instructions discussed.  Followup with primary care physician for other health issues as would normally be done.  Consider screening for malignancies (breast, prostate, colon, melanoma, etc) as appropriate.  Questions answered.  The patient expressed understanding and appreciation

## 2013-12-26 NOTE — Patient Instructions (Signed)
ANORECTAL SURGERY: POST OP INSTRUCTIONS  1. Take your usually prescribed home medications unless otherwise directed. 2. DIET: Follow a light bland diet the first 24 hours after arrival home, such as soup, liquids, crackers, etc.  Be sure to include lots of fluids daily.  Avoid fast food or heavy meals as your are more likely to get nauseated.  Eat a low fat the next few days after surgery.   3. PAIN CONTROL: a. Pain is best controlled by a usual combination of three different methods TOGETHER: i. Ice/Heat ii. Over the counter pain medication iii. Prescription pain medication b. Most patients will experience some swelling and discomfort in the anus/rectal area. and incisions.  Ice packs or heat (30-60 minutes up to 6 times a day) will help. Use ice for the first few days to help decrease swelling and bruising, then switch to heat such as warm towels, sitz baths, warm baths, etc to help relax tight/sore spots and speed recovery.  Some people prefer to use ice alone, heat alone, alternating between ice & heat.  Experiment to what works for you.  Swelling and bruising can take several weeks to resolve.   c. It is helpful to take an over-the-counter pain medication regularly for the first few weeks.  Choose one of the following that works best for you: i. Naproxen (Aleve, etc)  Two 220mg tabs twice a day ii. Ibuprofen (Advil, etc) Three 200mg tabs four times a day (every meal & bedtime) iii. Acetaminophen (Tylenol, etc) 500-650mg four times a day (every meal & bedtime) d. A  prescription for pain medication (such as oxycodone, hydrocodone, etc) should be given to you upon discharge.  Take your pain medication as prescribed.  i. If you are having problems/concerns with the prescription medicine (does not control pain, nausea, vomiting, rash, itching, etc), please call us [phone] to see if we need to switch you to a different pain medicine that will work better for you and/or control your side effect  better. ii. If you need a refill on your pain medication, please contact your pharmacy.  They will contact our office to request authorization. Prescriptions will not be filled after 5 pm or on week-ends.  Use a Sitz Bath 4-8 times a day for relief A sitz bath is a warm water bath taken in the sitting position that covers only the hips and buttocks. It may be used for either healing or hygiene purposes. Sitz baths are also used to relieve pain, itching, or muscle spasms. The water may contain medicine. Moist heat will help you heal and relax.  HOME CARE INSTRUCTIONS  Take 3 to 4 sitz baths a day. 1. Fill the bathtub half full with warm water. 2. Sit in the water and open the drain a little. 3. Turn on the warm water to keep the tub half full. Keep the water running constantly. 4. Soak in the water for 15 to 20 minutes. 5. After the sitz bath, pat the affected area dry first. SEEK MEDICAL CARE IF:  You get worse instead of better. Stop the sitz baths if you get worse.   4. KEEP YOUR BOWELS REGULAR a. The goal is one bowel movement a day b. Avoid getting constipated.  Between the surgery and the pain medications, it is common to experience some constipation.  Increasing fluid intake and taking a fiber supplement (such as Metamucil, Citrucel, FiberCon, MiraLax, etc) 1-2 times a day regularly will usually help prevent this problem from occurring.  A mild   laxative (prune juice, Milk of Magnesia, MiraLax, etc) should be taken according to package directions if there are no bowel movements after 48 hours. c. Watch out for diarrhea.  If you have many loose bowel movements, simplify your diet to bland foods & liquids for a few days.  Stop any stool softeners and decrease your fiber supplement.  Switching to mild anti-diarrheal medications (Kayopectate, Pepto Bismol) can help.  If this worsens or does not improve, please call us.  5. Wound Care a. Remove your bandages the day after surgery.  Unless  discharge instructions indicate otherwise, leave your bandage dry and in place overnight.  Remove the bandage during your first bowel movement.   b. Allow the wound packing to fall out over the next few days.  You can trim exposed gauze / ribbon as it falls out.  You do not need to repack the wound unless instructed otherwise.  Wear an absorbent pad or soft cotton gauze in your underwear as needed to catch any drainage and help keep the area  c. Keep the area clean and dry.  Bathe / shower every day.  Keep the area clean by showering / bathing over the incision / wound.   It is okay to soak an open wound to help wash it.  Wet wipes or showers / gentle washing after bowel movements is often less traumatic than regular toilet paper. d. You may have some styrofoam-like soft packing in the rectum which will come out with the first bowel movement.  e. You will often notice bleeding with bowel movements.  This should slow down by the end of the first week of surgery f. Expect some drainage.  This should slow down, too, by the end of the first week of surgery.  Wear an absorbent pad or soft cotton gauze in your underwear until the drainage stops. 6. ACTIVITIES as tolerated:   a. You may resume regular (light) daily activities beginning the next day-such as daily self-care, walking, climbing stairs-gradually increasing activities as tolerated.  If you can walk 30 minutes without difficulty, it is safe to try more intense activity such as jogging, treadmill, bicycling, low-impact aerobics, swimming, etc. b. Save the most intensive and strenuous activity for last such as sit-ups, heavy lifting, contact sports, etc  Refrain from any heavy lifting or straining until you are off narcotics for pain control.   c. DO NOT PUSH THROUGH PAIN.  Let pain be your guide: If it hurts to do something, don't do it.  Pain is your body warning you to avoid that activity for another week until the pain goes down. d. You may drive when  you are no longer taking prescription pain medication, you can comfortably sit for long periods of time, and you can safely maneuver your car and apply brakes. e. You may have sexual intercourse when it is comfortable.  7. FOLLOW UP in our office a. Please call [facility] at [phone] to set up an appointment to see your surgeon in the office for a follow-up appointment approximately 2 weeks after your surgery. b. Make sure that you call for this appointment the day you arrive home to insure a convenient appointment time. 10. IF YOU HAVE DISABILITY OR FAMILY LEAVE FORMS, BRING THEM TO THE OFFICE FOR PROCESSING.  DO NOT GIVE THEM TO YOUR DOCTOR.        WHEN TO CALL US [phone]: 1. Poor pain control 2. Reactions / problems with new medications (rash/itching, nausea, etc)    3. Fever over 101.5 F (38.5 C) 4. Inability to urinate 5. Nausea and/or vomiting 6. Worsening swelling or bruising 7. Continued bleeding from incision. 8. Increased pain, redness, or drainage from the incision  The clinic staff is available to answer your questions during regular business hours (8:30am-5pm).  Please don't hesitate to call and ask to speak to one of our nurses for clinical concerns.   A surgeon from [facility] is always on call at the hospitals   If you have a medical emergency, go to the nearest emergency room or call 911.    [facility] [address], [place], [place]  [address] ? MAIN: [phone] ? TOLL FREE: [phone] ? FAX [phone] [email or web address]  Anal Fistula An anal fistula is an abnormal tunnel that develops between the bowel and skin near the outside of the anus, where feces comes out. The anus has a number of tiny glands that make lubricating fluid. Sometimes these glands can become plugged and infected. This may lead to the development of a fluid-filled pocket (abscess). An anal fistula often develops after  this infection or abscess. It is nearly always caused by a past or current anal abscess.  CAUSES  Though an anal fistula is almost always caused by a past or current anal abscess, other causes can include:  A complication of surgery.  Trauma to the rectal area.  Radiation to the area.  Other medical conditions or diseases, such as:   Chronic inflammatory bowel disease, such as Crohn disease or ulcerative colitis.   Colon or rectal cancer.   Diverticular disease, such as diverticulitis.   A sexually transmitted disease, such as gonorrhea, chlamydia, or syphilis.  An HIV infection or AIDS.  SYMPTOMS   Throbbing or constant pain that may be worse when sitting.   Swelling or irritation around the anus.   Drainage of pus or blood from an opening near the anus.   Pain with bowel movements.  Fever or chills. DIAGNOSIS  Your caregiver will examine the area to find the openings of the anal fistula and the fistula tract. The external opening of the anal fistula may be seen during a physical examination. Other examinations that may be performed include:   Examination of the rectal area with a gloved hand (digital rectal exam).   Examination with a probe or scope to help locate the internal opening of the fistula.   Injection of a dye into the fistula opening. X-rays can be taken to find the exact location and path of the fistula.   An MRI or ultrasound of the anal area.  Other tests may be performed to find the cause of the anal fistula.   TREATMENT  The most common treatment for an anal fistula is surgery. There are different surgery options depending on where your fistula is located and how complex the fistula is. Surgical options include:  A fistulotomy. This surgery involves opening up the whole fistula and draining the contents inside to promote healing.  Seton placement. A silk string (seton) is placed into the fistula during a fistulotomy to drain any  infection to promote healing.  Advancement flap procedure. Tissue is removed from your rectum or the skin around the anus and is attached to the opening of the fistula.  Bioprosthetic plug. A cone-shaped plug is made from your tissue and is used to block the opening of the fistula. Some anal fistulas do not require surgery. A fibrin glue is a  non-surgical option that involves injecting the glue to seal the fistula. You also may be prescribed an antibiotic medicine to treat an infection.  HOME CARE INSTRUCTIONS   Take your antibiotics as directed. Finish them even if you start to feel better.  Only take over-the-counter or prescription medicines as directed by your caregiver.Use a stool softener or laxative, if recommended.   Eat a high-fiber diet to help avoid constipation or as directed by your caregiver.  Drink enough water to keep your urine clear or pale yellow.   A warm sitz bath may be soothing and help with healing. You may take warm sitz baths for 15 20 minutes, 3 4 times a day to ease pain and discomfort.   Follow excellent hygiene to keep the anal area as clean and dry as possible. Use wet toilet paper or moist towelettes after each bowel movement.  SEEK MEDICAL CARE IF: You have increased pain not controlled with medicines.  SEEK IMMEDIATE MEDICAL CARE IF:  You have severe, intolerable pain.  You have new swelling, redness, or discharge around the anal area.  You have tenderness or warmth around the anal area.  You have chills or diarrhea.  You have severe problems urinating or having a bowel movement.   You have a fever or persistent symptoms for more than 2 3 days.   You have a fever and your symptoms suddenly get worse.  MAKE SURE YOU:   Understand these instructions.  Will watch your condition.  Will get help right away if you are not doing well or get worse. Document Released: 09/01/2008 Document Revised: 09/05/2012 Document Reviewed:  07/25/2011 [facility] Patient Information 2014 [place], [place].  GETTING TO GOOD BOWEL HEALTH. Irregular bowel habits such as constipation and diarrhea can lead to many problems over time.  Having one soft bowel movement a day is the most important way to prevent further problems.  The anorectal canal is designed to handle stretching and feces to safely manage our ability to get rid of solid waste (feces, poop, stool) out of our body.  BUT, hard constipated stools can act like ripping concrete bricks and diarrhea can be a burning fire to this very sensitive area of our body, causing inflamed hemorrhoids, anal fissures, increasing risk is perirectal abscesses, abdominal pain/bloating, an making irritable bowel worse.     The goal: ONE SOFT BOWEL MOVEMENT A DAY!  To have soft, regular bowel movements:    Drink at least 8 tall glasses of water a day.     Take plenty of fiber.  Fiber is the undigested part of plant food that passes into the colon, acting s "natures broom" to encourage bowel motility and movement.  Fiber can absorb and hold large amounts of water. This results in a larger, bulkier stool, which is soft and easier to pass. Work gradually over several weeks up to 6 servings a day of fiber (25g a day even more if needed) in the form of: o Vegetables -- Root (potatoes, carrots, turnips), leafy green (lettuce, salad greens, celery, spinach), or cooked high residue (cabbage, broccoli, etc) o Fruit -- Fresh (unpeeled skin & pulp), Dried (prunes, apricots, cherries, etc ),  or stewed ( applesauce)  o Whole grain breads, pasta, etc (whole wheat)  o Bran cereals    Bulking Agents -- This type of water-retaining fiber generally is easily obtained each day by one of the following:  o Psyllium bran -- The psyllium plant is remarkable because its ground seeds can retain so  much water. This product is available as Metamucil, Konsyl, Effersyllium, Per Diem Fiber, or the less expensive generic preparation in  drug and health food stores. Although labeled a laxative, it really is not a laxative.  o Methylcellulose -- This is another fiber derived from wood which also retains water. It is available as Citrucel. o Polyethylene Glycol - and "artificial" fiber commonly called Miralax or Glycolax.  It is helpful for people with gassy or bloated feelings with regular fiber o Flax Seed - a less gassy fiber than psyllium   No reading or other relaxing activity while on the toilet. If bowel movements take longer than 5 minutes, you are too constipated   AVOID CONSTIPATION.  High fiber and water intake usually takes care of this.  Sometimes a laxative is needed to stimulate more frequent bowel movements, but    Laxatives are not a good long-term solution as it can wear the colon out. o Osmotics (Milk of Magnesia, Fleets phosphosoda, Magnesium citrate, MiraLax, GoLytely) are safer than  o Stimulants (Senokot, Castor Oil, Dulcolax, Ex Lax)    o Do not take laxatives for more than 7days in a row.    IF SEVERELY CONSTIPATED, try a Bowel Retraining Program: o Do not use laxatives.  o Eat a diet high in roughage, such as bran cereals and leafy vegetables.  o Drink six (6) ounces of prune or apricot juice each morning.  o Eat two (2) large servings of stewed fruit each day.  o Take one (1) heaping tablespoon of a psyllium-based bulking agent twice a day. Use sugar-free sweetener when possible to avoid excessive calories.  o Eat a normal breakfast.  o Set aside 15 minutes after breakfast to sit on the toilet, but do not strain to have a bowel movement.  o If you do not have a bowel movement by the third day, use an enema and repeat the above steps.    Controlling diarrhea o Switch to liquids and simpler foods for a few days to avoid stressing your intestines further. o Avoid dairy products (especially milk & ice cream) for a short time.  The intestines often can lose the ability to digest lactose when  stressed. o Avoid foods that cause gassiness or bloating.  Typical foods include beans and other legumes, cabbage, broccoli, and dairy foods.  Every person has some sensitivity to other foods, so listen to our body and avoid those foods that trigger problems for you. o Adding fiber (Citrucel, Metamucil, psyllium, Miralax) gradually can help thicken stools by absorbing excess fluid and retrain the intestines to act more normally.  Slowly increase the dose over a few weeks.  Too much fiber too soon can backfire and cause cramping & bloating. o Probiotics (such as active yogurt, Align, etc) may help repopulate the intestines and colon with normal bacteria and calm down a sensitive digestive tract.  Most studies show it to be of mild help, though, and such products can be costly. o Medicines:   Bismuth subsalicylate (ex. Kayopectate, Pepto Bismol) every 30 minutes for up to 6 doses can help control diarrhea.  Avoid if pregnant.   Loperamide (Immodium) can slow down diarrhea.  Start with two tablets (4mg  total) first and then try one tablet every 6 hours.  Avoid if you are having fevers or severe pain.  If you are not better or start feeling worse, stop all medicines and call your doctor for advice o Call your doctor if you are getting worse or not  better.  Sometimes further testing (cultures, endoscopy, X-ray studies, bloodwork, etc) may be needed to help diagnose and treat the cause of the diarrhea.  Managing Pain  Pain after surgery or related to activity is often due to strain/injury to muscle, tendon, nerves and/or incisions.  This pain is usually short-term and will improve in a few months.   Many people find it helpful to do the following things TOGETHER to help speed the process of healing and to get back to regular activity more quickly:  1. Avoid heavy physical activity a.  no lifting greater than 20 pounds b. Do not "push through" the pain.  Listen to your body and avoid positions and  maneuvers than reproduce the pain c. Walking is okay as tolerated, but go slowly and stop when getting sore.  d. Remember: If it hurts to do it, then don't do it! 2. Take Anti-inflammatory medication  a. Take with food/snack around the clock for 1-2 weeks i. This helps the muscle and nerve tissues become less irritable and calm down faster b. Choose ONE of the following over-the-counter medications: i. Naproxen 220mg  tabs (ex. Aleve) 1-2 pills twice a day  ii. Ibuprofen 200mg  tabs (ex. Advil, Motrin) 3-4 pills with every meal and just before bedtime iii. Acetaminophen 500mg  tabs (Tylenol) 1-2 pills with every meal and just before bedtime 3. Use a Heating pad or Ice/Cold Pack a. 4-6 times a day b. May use warm bath/hottub  or showers 4. Try Gentle Massage and/or Stretching  a. at the area of pain many times a day b. stop if you feel pain - do not overdo it  Try these steps together to help you body heal faster and avoid making things get worse.  Doing just one of these things may not be enough.    If you are not getting better after two weeks or are noticing you are getting worse, contact our office for further advice; we may need to re-evaluate you & see what other things we can do to help.

## 2014-01-20 ENCOUNTER — Ambulatory Visit (INDEPENDENT_AMBULATORY_CARE_PROVIDER_SITE_OTHER): Payer: BC Managed Care – PPO | Admitting: Surgery

## 2014-01-20 ENCOUNTER — Encounter (INDEPENDENT_AMBULATORY_CARE_PROVIDER_SITE_OTHER): Payer: BC Managed Care – PPO | Admitting: Surgery

## 2014-01-20 ENCOUNTER — Encounter (INDEPENDENT_AMBULATORY_CARE_PROVIDER_SITE_OTHER): Payer: Self-pay | Admitting: Surgery

## 2014-01-20 VITALS — BP 98/64 | HR 74 | Temp 97.1°F | Resp 14 | Ht 77.0 in | Wt 172.2 lb

## 2014-01-20 DIAGNOSIS — K603 Anal fistula, unspecified: Secondary | ICD-10-CM

## 2014-01-20 DIAGNOSIS — K604 Rectal fistula: Secondary | ICD-10-CM

## 2014-01-20 NOTE — Progress Notes (Signed)
Subjective:     Patient ID: Billy Garcia, male   DOB: 08/06/1991, 23 y.o.   MRN: [id number]  HPI   Note: This dictation was prepared with Dragon/digital dictation along with [facility] technology. Any transcriptional errors that result from this process are unintentional.       Billy Garcia  02/23/1991 [id number]  Patient Care Team: Judith PartMahesh S Sandhu, MD as Referring Physician (Emergency Medicine)  Procedure (Date: 10/18/2013):  Reason for visit: Pain at anus. Abscess & fissure   Procedure: Incision and drainage of perirectal abscess 10/18/2013  Procedure Examination under anesthesia.  Superficial fistulotomy.  External hemorrhoidectomy 12/12/2013  This patient returns for surgical re-evaluation.  The pain is less.  Mild blood when he wipes.  Drainage coming down.  His bowels are moving okay.  No fevers or chills.  He did have a painful bowel movement yesterday but thinks is related to fast food.  He is not really needing to put anything back there at all.  Patient Active Problem List   Diagnosis Date Noted  . 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 Anal fissure 10/18/2013    Past Medical History  Diagnosis Date  . Perianal abscess, right posterior s/o I&D 10/18/2013 10/18/2013  . 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 Incision and drainage perirectal abscess  10/17/2013    History   Social History  . Marital Status: Single    Spouse Name: N/A    Number of Children: N/A  . Years of Education: N/A   Occupational History  . Not on file.   Social History Main Topics  . Smoking status: Never Smoker   . Smokeless tobacco: Never Used  . Alcohol Use: Yes     Comment: occasional  . Drug Use: No  . Sexual Activity: Not on file   Other Topics Concern  . Not on file   Social History Narrative  . No narrative on file     History reviewed. No pertinent family history.  No current outpatient prescriptions on file.   No current facility-administered medications for this visit.     No Known Allergies  BP 98/64  Pulse 74  Temp(Src) 97.1 F (36.2 C) (Temporal)  Resp 14  Ht 6\' 5"  (1.956 m)  Wt 172 lb 3.2 oz (78.109 kg)  BMI 20.42 kg/m2  No results found.   Review of Systems  Constitutional: Negative for fever, chills and diaphoresis.  HENT: Negative for sore throat and trouble swallowing.   Eyes: Negative for photophobia and visual disturbance.  Respiratory: Negative for choking and shortness of breath.   Cardiovascular: Negative for chest pain and palpitations.  Gastrointestinal: Negative for nausea, vomiting, abdominal pain, diarrhea, constipation and abdominal distention.  Genitourinary: Negative for dysuria, urgency, difficulty urinating and testicular pain.  Musculoskeletal: Negative for arthralgias, gait problem, myalgias and neck pain.  Skin: Negative for color change and rash.  Neurological: Negative for dizziness, speech difficulty, weakness and numbness.  Hematological: Negative for adenopathy.  Psychiatric/Behavioral: Negative for hallucinations, confusion and agitation.       Objective:   Physical Exam  Constitutional: He is oriented to person, place, and time. He appears well-developed and well-nourished. No distress.  HENT:  Head: Normocephalic.  Mouth/Throat: Oropharynx is clear and moist. No oropharyngeal exudate.  Eyes: Conjunctivae and EOM are normal. Pupils are equal, round, and reactive to light. No scleral icterus.  Neck: Normal range  of motion. No tracheal deviation present.  Cardiovascular: Normal rate, normal heart sounds and intact distal pulses.   Pulmonary/Chest: Effort normal. No respiratory distress.  Abdominal: Soft. He exhibits no distension. There is no tenderness. Hernia confirmed negative in the right inguinal area and confirmed negative in the left  inguinal area.  Incisions clean with normal healing ridges.  No hernias  Genitourinary: Rectal exam shows fissure.     Musculoskeletal: Normal range of motion. He exhibits no tenderness.  Neurological: He is alert and oriented to person, place, and time. No cranial nerve deficit. He exhibits normal muscle tone. Coordination normal.  Skin: Skin is warm and dry. No rash noted. He is not diaphoretic.  Psychiatric: He has a normal mood and affect. His behavior is normal.       Assessment:     Healing status post superficial fistulotomy of perianal fistula    Plan:     Continue baths/ showers every day.  Keep the area clean and dry.  Dry powder/cottonball/gauze as tolerated.  This should gradually epithelialize over and heal over the next few months  Increase activity as tolerated to regular activity.  Low impact exercise such as walking an hour a day at least ideal.  Do not push through pain.  Diet as tolerated.  Low fat high fiber diet ideal.  Bowel regimen with 30 g fiber a day and fiber supplement as needed to avoid problems.  Return to clinic q3-4 weeks until healed.   Instructions discussed.  Followup with primary care physician for other health issues as would normally be done.  Consider screening for malignancies (breast, prostate, colon, melanoma, etc) as appropriate.  Questions answered.  The patient expressed understanding and appreciation

## 2014-01-20 NOTE — Patient Instructions (Signed)
ANORECTAL SURGERY: POST OP INSTRUCTIONS  1. Take your usually prescribed home medications unless otherwise directed. 2. DIET: Follow a light bland diet the first 24 hours after arrival home, such as soup, liquids, crackers, etc.  Be sure to include lots of fluids daily.  Avoid fast food or heavy meals as your are more likely to get nauseated.  Eat a low fat the next few days after surgery.   3. PAIN CONTROL: a. Pain is best controlled by a usual combination of three different methods TOGETHER: i. Ice/Heat ii. Over the counter pain medication iii. Prescription pain medication b. Most patients will experience some swelling and discomfort in the anus/rectal area. and incisions.  Ice packs or heat (30-60 minutes up to 6 times a day) will help. Use ice for the first few days to help decrease swelling and bruising, then switch to heat such as warm towels, sitz baths, warm baths, etc to help relax tight/sore spots and speed recovery.  Some people prefer to use ice alone, heat alone, alternating between ice & heat.  Experiment to what works for you.  Swelling and bruising can take several weeks to resolve.   c. It is helpful to take an over-the-counter pain medication regularly for the first few weeks.  Choose one of the following that works best for you: i. Naproxen (Aleve, etc)  Two 220mg tabs twice a day ii. Ibuprofen (Advil, etc) Three 200mg tabs four times a day (every meal & bedtime) iii. Acetaminophen (Tylenol, etc) 500-650mg four times a day (every meal & bedtime) d. A  prescription for pain medication (such as oxycodone, hydrocodone, etc) should be given to you upon discharge.  Take your pain medication as prescribed.  i. If you are having problems/concerns with the prescription medicine (does not control pain, nausea, vomiting, rash, itching, etc), please call us [phone] to see if we need to switch you to a different pain medicine that will work better for you and/or control your side effect  better. ii. If you need a refill on your pain medication, please contact your pharmacy.  They will contact our office to request authorization. Prescriptions will not be filled after 5 pm or on week-ends.  Use a Sitz Bath 4-8 times a day for relief A sitz bath is a warm water bath taken in the sitting position that covers only the hips and buttocks. It may be used for either healing or hygiene purposes. Sitz baths are also used to relieve pain, itching, or muscle spasms. The water may contain medicine. Moist heat will help you heal and relax.  HOME CARE INSTRUCTIONS  Take 3 to 4 sitz baths a day. 1. Fill the bathtub half full with warm water. 2. Sit in the water and open the drain a little. 3. Turn on the warm water to keep the tub half full. Keep the water running constantly. 4. Soak in the water for 15 to 20 minutes. 5. After the sitz bath, pat the affected area dry first. SEEK MEDICAL CARE IF:  You get worse instead of better. Stop the sitz baths if you get worse.   4. KEEP YOUR BOWELS REGULAR a. The goal is one bowel movement a day b. Avoid getting constipated.  Between the surgery and the pain medications, it is common to experience some constipation.  Increasing fluid intake and taking a fiber supplement (such as Metamucil, Citrucel, FiberCon, MiraLax, etc) 1-2 times a day regularly will usually help prevent this problem from occurring.  A mild   laxative (prune juice, Milk of Magnesia, MiraLax, etc) should be taken according to package directions if there are no bowel movements after 48 hours. c. Watch out for diarrhea.  If you have many loose bowel movements, simplify your diet to bland foods & liquids for a few days.  Stop any stool softeners and decrease your fiber supplement.  Switching to mild anti-diarrheal medications (Kayopectate, Pepto Bismol) can help.  If this worsens or does not improve, please call us.  5. Wound Care a. Remove your bandages the day after surgery.  Unless  discharge instructions indicate otherwise, leave your bandage dry and in place overnight.  Remove the bandage during your first bowel movement.   b. Allow the wound packing to fall out over the next few days.  You can trim exposed gauze / ribbon as it falls out.  You do not need to repack the wound unless instructed otherwise.  Wear an absorbent pad or soft cotton gauze in your underwear as needed to catch any drainage and help keep the area  c. Keep the area clean and dry.  Bathe / shower every day.  Keep the area clean by showering / bathing over the incision / wound.   It is okay to soak an open wound to help wash it.  Wet wipes or showers / gentle washing after bowel movements is often less traumatic than regular toilet paper. d. You may have some styrofoam-like soft packing in the rectum which will come out with the first bowel movement.  e. You will often notice bleeding with bowel movements.  This should slow down by the end of the first week of surgery f. Expect some drainage.  This should slow down, too, by the end of the first week of surgery.  Wear an absorbent pad or soft cotton gauze in your underwear until the drainage stops. 6. ACTIVITIES as tolerated:   a. You may resume regular (light) daily activities beginning the next day-such as daily self-care, walking, climbing stairs-gradually increasing activities as tolerated.  If you can walk 30 minutes without difficulty, it is safe to try more intense activity such as jogging, treadmill, bicycling, low-impact aerobics, swimming, etc. b. Save the most intensive and strenuous activity for last such as sit-ups, heavy lifting, contact sports, etc  Refrain from any heavy lifting or straining until you are off narcotics for pain control.   c. DO NOT PUSH THROUGH PAIN.  Let pain be your guide: If it hurts to do something, don't do it.  Pain is your body warning you to avoid that activity for another week until the pain goes down. d. You may drive when  you are no longer taking prescription pain medication, you can comfortably sit for long periods of time, and you can safely maneuver your car and apply brakes. e. You may have sexual intercourse when it is comfortable.  7. FOLLOW UP in our office a. Please call [facility] at [phone] to set up an appointment to see your surgeon in the office for a follow-up appointment approximately 2 weeks after your surgery. b. Make sure that you call for this appointment the day you arrive home to insure a convenient appointment time. 10. IF YOU HAVE DISABILITY OR FAMILY LEAVE FORMS, BRING THEM TO THE OFFICE FOR PROCESSING.  DO NOT GIVE THEM TO YOUR DOCTOR.        WHEN TO CALL US [phone]: 1. Poor pain control 2. Reactions / problems with new medications (rash/itching, nausea, etc)    3. Fever over 101.5 F (38.5 C) 4. Inability to urinate 5. Nausea and/or vomiting 6. Worsening swelling or bruising 7. Continued bleeding from incision. 8. Increased pain, redness, or drainage from the incision  The clinic staff is available to answer your questions during regular business hours (8:30am-5pm).  Please don't hesitate to call and ask to speak to one of our nurses for clinical concerns.   A surgeon from [facility] is always on call at the hospitals   If you have a medical emergency, go to the nearest emergency room or call 911.    [facility] [address], [place], [place]  [address] ? MAIN: [phone] ? TOLL FREE: [phone] ? FAX [phone] [email or web address]  Anal Fistula An anal fistula is an abnormal tunnel that develops between the bowel and skin near the outside of the anus, where feces comes out. The anus has a number of tiny glands that make lubricating fluid. Sometimes these glands can become plugged and infected. This may lead to the development of a fluid-filled pocket (abscess). An anal fistula often develops after  this infection or abscess. It is nearly always caused by a past or current anal abscess.  CAUSES  Though an anal fistula is almost always caused by a past or current anal abscess, other causes can include:  A complication of surgery.  Trauma to the rectal area.  Radiation to the area.  Other medical conditions or diseases, such as:   Chronic inflammatory bowel disease, such as Crohn disease or ulcerative colitis.   Colon or rectal cancer.   Diverticular disease, such as diverticulitis.   A sexually transmitted disease, such as gonorrhea, chlamydia, or syphilis.  An HIV infection or AIDS.  SYMPTOMS   Throbbing or constant pain that may be worse when sitting.   Swelling or irritation around the anus.   Drainage of pus or blood from an opening near the anus.   Pain with bowel movements.  Fever or chills. DIAGNOSIS  Your caregiver will examine the area to find the openings of the anal fistula and the fistula tract. The external opening of the anal fistula may be seen during a physical examination. Other examinations that may be performed include:   Examination of the rectal area with a gloved hand (digital rectal exam).   Examination with a probe or scope to help locate the internal opening of the fistula.   Injection of a dye into the fistula opening. X-rays can be taken to find the exact location and path of the fistula.   An MRI or ultrasound of the anal area.  Other tests may be performed to find the cause of the anal fistula.   TREATMENT  The most common treatment for an anal fistula is surgery. There are different surgery options depending on where your fistula is located and how complex the fistula is. Surgical options include:  A fistulotomy. This surgery involves opening up the whole fistula and draining the contents inside to promote healing.  Seton placement. A silk string (seton) is placed into the fistula during a fistulotomy to drain any  infection to promote healing.  Advancement flap procedure. Tissue is removed from your rectum or the skin around the anus and is attached to the opening of the fistula.  Bioprosthetic plug. A cone-shaped plug is made from your tissue and is used to block the opening of the fistula. Some anal fistulas do not require surgery. A fibrin glue is a  non-surgical option that involves injecting the glue to seal the fistula. You also may be prescribed an antibiotic medicine to treat an infection.  HOME CARE INSTRUCTIONS   Take your antibiotics as directed. Finish them even if you start to feel better.  Only take over-the-counter or prescription medicines as directed by your caregiver.Use a stool softener or laxative, if recommended.   Eat a high-fiber diet to help avoid constipation or as directed by your caregiver.  Drink enough water to keep your urine clear or pale yellow.   A warm sitz bath may be soothing and help with healing. You may take warm sitz baths for 15 20 minutes, 3 4 times a day to ease pain and discomfort.   Follow excellent hygiene to keep the anal area as clean and dry as possible. Use wet toilet paper or moist towelettes after each bowel movement.  SEEK MEDICAL CARE IF: You have increased pain not controlled with medicines.  SEEK IMMEDIATE MEDICAL CARE IF:  You have severe, intolerable pain.  You have new swelling, redness, or discharge around the anal area.  You have tenderness or warmth around the anal area.  You have chills or diarrhea.  You have severe problems urinating or having a bowel movement.   You have a fever or persistent symptoms for more than 2 3 days.   You have a fever and your symptoms suddenly get worse.  MAKE SURE YOU:   Understand these instructions.  Will watch your condition.  Will get help right away if you are not doing well or get worse. Document Released: 09/01/2008 Document Revised: 09/05/2012 Document Reviewed:  07/25/2011 [employer] [facility] [employer] Patient Information 2014 [employer] [place].

## 2014-02-10 ENCOUNTER — Encounter (INDEPENDENT_AMBULATORY_CARE_PROVIDER_SITE_OTHER): Payer: Self-pay

## 2014-02-10 ENCOUNTER — Ambulatory Visit (INDEPENDENT_AMBULATORY_CARE_PROVIDER_SITE_OTHER): Payer: BC Managed Care – PPO | Admitting: Surgery

## 2014-02-10 ENCOUNTER — Encounter (INDEPENDENT_AMBULATORY_CARE_PROVIDER_SITE_OTHER): Payer: Self-pay | Admitting: Surgery

## 2014-02-10 VITALS — BP 102/70 | HR 68 | Resp 16 | Ht 77.0 in | Wt 177.0 lb

## 2014-02-10 DIAGNOSIS — K603 Anal fistula: Secondary | ICD-10-CM

## 2014-02-10 DIAGNOSIS — K602 Anal fissure, unspecified: Secondary | ICD-10-CM

## 2014-02-10 DIAGNOSIS — K604 Rectal fistula: Secondary | ICD-10-CM

## 2014-02-10 NOTE — Patient Instructions (Signed)
ANORECTAL SURGERY: POST OP INSTRUCTIONS  1. Take your usually prescribed home medications unless otherwise directed. 2. DIET: Follow a light bland diet the first 24 hours after arrival home, such as soup, liquids, crackers, etc.  Be sure to include lots of fluids daily.  Avoid fast food or heavy meals as your are more likely to get nauseated.  Eat a low fat the next few days after surgery.   3. PAIN CONTROL: a. Pain is best controlled by a usual combination of three different methods TOGETHER: i. Ice/Heat ii. Over the counter pain medication iii. Prescription pain medication b. Most patients will experience some swelling and discomfort in the anus/rectal area. and incisions.  Ice packs or heat (30-60 minutes up to 6 times a day) will help. Use ice for the first few days to help decrease swelling and bruising, then switch to heat such as warm towels, sitz baths, warm baths, etc to help relax tight/sore spots and speed recovery.  Some people prefer to use ice alone, heat alone, alternating between ice & heat.  Experiment to what works for you.  Swelling and bruising can take several weeks to resolve.   c. It is helpful to take an over-the-counter pain medication regularly for the first few weeks.  Choose one of the following that works best for you: i. Naproxen (Aleve, etc)  Two 220mg tabs twice a day ii. Ibuprofen (Advil, etc) Three 200mg tabs four times a day (every meal & bedtime) iii. Acetaminophen (Tylenol, etc) 500-650mg four times a day (every meal & bedtime) d. A  prescription for pain medication (such as oxycodone, hydrocodone, etc) should be given to you upon discharge.  Take your pain medication as prescribed.  i. If you are having problems/concerns with the prescription medicine (does not control pain, nausea, vomiting, rash, itching, etc), please call us [phone] to see if we need to switch you to a different pain medicine that will work better for you and/or control your side effect  better. ii. If you need a refill on your pain medication, please contact your pharmacy.  They will contact our office to request authorization. Prescriptions will not be filled after 5 pm or on week-ends.  Use a Sitz Bath 4-8 times a day for relief A sitz bath is a warm water bath taken in the sitting position that covers only the hips and buttocks. It may be used for either healing or hygiene purposes. Sitz baths are also used to relieve pain, itching, or muscle spasms. The water may contain medicine. Moist heat will help you heal and relax.  HOME CARE INSTRUCTIONS  Take 3 to 4 sitz baths a day. 1. Fill the bathtub half full with warm water. 2. Sit in the water and open the drain a little. 3. Turn on the warm water to keep the tub half full. Keep the water running constantly. 4. Soak in the water for 15 to 20 minutes. 5. After the sitz bath, pat the affected area dry first. SEEK MEDICAL CARE IF:  You get worse instead of better. Stop the sitz baths if you get worse.   4. KEEP YOUR BOWELS REGULAR a. The goal is one bowel movement a day b. Avoid getting constipated.  Between the surgery and the pain medications, it is common to experience some constipation.  Increasing fluid intake and taking a fiber supplement (such as Metamucil, Citrucel, FiberCon, MiraLax, etc) 1-2 times a day regularly will usually help prevent this problem from occurring.  A mild   laxative (prune juice, Milk of Magnesia, MiraLax, etc) should be taken according to package directions if there are no bowel movements after 48 hours. c. Watch out for diarrhea.  If you have many loose bowel movements, simplify your diet to bland foods & liquids for a few days.  Stop any stool softeners and decrease your fiber supplement.  Switching to mild anti-diarrheal medications (Kayopectate, Pepto Bismol) can help.  If this worsens or does not improve, please call us.  5. Wound Care a. Remove your bandages the day after surgery.  Unless  discharge instructions indicate otherwise, leave your bandage dry and in place overnight.  Remove the bandage during your first bowel movement.   b. Allow the wound packing to fall out over the next few days.  You can trim exposed gauze / ribbon as it falls out.  You do not need to repack the wound unless instructed otherwise.  Wear an absorbent pad or soft cotton gauze in your underwear as needed to catch any drainage and help keep the area  c. Keep the area clean and dry.  Bathe / shower every day.  Keep the area clean by showering / bathing over the incision / wound.   It is okay to soak an open wound to help wash it.  Wet wipes or showers / gentle washing after bowel movements is often less traumatic than regular toilet paper. d. You may have some styrofoam-like soft packing in the rectum which will come out with the first bowel movement.  e. You will often notice bleeding with bowel movements.  This should slow down by the end of the first week of surgery f. Expect some drainage.  This should slow down, too, by the end of the first week of surgery.  Wear an absorbent pad or soft cotton gauze in your underwear until the drainage stops. 6. ACTIVITIES as tolerated:   a. You may resume regular (light) daily activities beginning the next day-such as daily self-care, walking, climbing stairs-gradually increasing activities as tolerated.  If you can walk 30 minutes without difficulty, it is safe to try more intense activity such as jogging, treadmill, bicycling, low-impact aerobics, swimming, etc. b. Save the most intensive and strenuous activity for last such as sit-ups, heavy lifting, contact sports, etc  Refrain from any heavy lifting or straining until you are off narcotics for pain control.   c. DO NOT PUSH THROUGH PAIN.  Let pain be your guide: If it hurts to do something, don't do it.  Pain is your body warning you to avoid that activity for another week until the pain goes down. d. You may drive when  you are no longer taking prescription pain medication, you can comfortably sit for long periods of time, and you can safely maneuver your car and apply brakes. e. You may have sexual intercourse when it is comfortable.  7. FOLLOW UP in our office a. Please call [facility] at [phone] to set up an appointment to see your surgeon in the office for a follow-up appointment approximately 2 weeks after your surgery. b. Make sure that you call for this appointment the day you arrive home to insure a convenient appointment time. 10. IF YOU HAVE DISABILITY OR FAMILY LEAVE FORMS, BRING THEM TO THE OFFICE FOR PROCESSING.  DO NOT GIVE THEM TO YOUR DOCTOR.        WHEN TO CALL US [phone]: 1. Poor pain control 2. Reactions / problems with new medications (rash/itching, nausea, etc)    3. Fever over 101.5 F (38.5 C) 4. Inability to urinate 5. Nausea and/or vomiting 6. Worsening swelling or bruising 7. Continued bleeding from incision. 8. Increased pain, redness, or drainage from the incision  The clinic staff is available to answer your questions during regular business hours (8:30am-5pm).  Please don't hesitate to call and ask to speak to one of our nurses for clinical concerns.   [facility] is always on call at the hospitals   If you have a medical emergency, go to the nearest emergency room or call 911.    [facility] [address], [address], [place], [place]  [id number] ? MAIN: [phone] ? TOLL FREE: [phone] ? FAX [phone] [email or web address]    TREATMENT OF FLARE If these preventive measures fail, you must take action right away! Hemorrhoids are one condition that can be mild in the morning and become intolerable by nightfall. Most hemorrhoidal flares take several weeks to calm down.  These suggestions can help: Warm soaks.  This helps more than any topical medication.  Use up to 8 times a day.  Usually  sitz baths or sitting in a warm bathtub helps.  Sitting on moist warm towels are helpful.  Switching to ice packs/cool compresses can be helpful  Use a Sitz Bath 4-8 times a day for relief A sitz bath is a warm water bath taken in the sitting position that covers only the hips and buttocks. It may be used for either healing or hygiene purposes. Sitz baths are also used to relieve pain, itching, or muscle spasms. The water may contain medicine. Moist heat will help you heal and relax.  HOME CARE INSTRUCTIONS  Take 3 to 4 sitz baths a day. 6. Fill the bathtub half full with warm water. 7. Sit in the water and open the drain a little. 8. Turn on the warm water to keep the tub half full. Keep the water running constantly. 9. Soak in the water for 15 to 20 minutes. 10. After the sitz bath, pat the affected area dry first. SEEK MEDICAL CARE IF:  You get worse instead of better. Stop the sitz baths if you get worse.  Normalize your bowels.  Extremes of diarrhea or constipation will make hemorrhoids worse.  One soft bowel movement a day is the goal.  Fiber can help get your bowels regular Wet wipes instead of toilet paper Pain control with a NSAID such as ibuprofen (Advil) or naproxen (Aleve) or acetaminophen (Tylenol) around the clock.  Narcotics are constipating and should be minimized if possible Topical creams contain steroids (bydrocortisone) or local anesthetic (xylocaine) can help make pain and itching more tolerable.   EVALUATION If hemorrhoids are still causing problems, you could benefit by an evaluation by a surgeon.  The surgeon will obtain a history and examine you.  If hemorrhoids are diagnosed, some therapies can be offered in the office, usually with an anoscope into the less sensitive area of the rectum: -injection of hemorrhoids (sclerotherapy) can scar the blood vessels of the swollen/enlarged hemorrhoids to help shrink them down to a more normal size -rubber banding of the enlarged  hemorrhoids to help shrink them down to a more normal size -drainage of the blood clot causing a thrombosed hemorrhoid,  to relieve the severe pain   While 90% of the time such problems from hemorrhoids can be managed without preceding to surgery, sometimes the hemorrhoids require a operation to control the problem (uncontrolled bleeding,  prolapse, pain, etc.).   This involves being placed under general anesthesia where the surgeon can confirm the diagnosis and remove, suture, or staple the hemorrhoid(s).  Your surgeon can help you treat the problem appropriately.

## 2014-02-10 NOTE — Progress Notes (Signed)
Subjective:     Patient ID: Billy Garcia, male   DOB: 10/17/1990, 23 y.o.   MRN: [id number]  HPI   Note: This dictation was prepared with Dragon/digital dictation along with [facility] technology. Any transcriptional errors that result from this process are unintentional.       Billy AdaGrant Persley  06/20/1991 [id number]  Patient Care Team: Judith PartMahesh S Sandhu, MD as Referring Physician (Emergency Medicine)  Reason for visit: Pain at anus. Abscess & fissure   Procedure: Incision and drainage of perirectal abscess 10/18/2013  Procedure Examination under anesthesia.  Superficial fistulotomy.  External hemorrhoidectomy 12/12/2013  This patient returns for surgical re-evaluation.  The pain is Mild.  Games up to 10 hour at a time.  Can be sore after sitting for long periods.  Mild blood when he wipes.  Drainage minimal.  His bowels are moving okay.  No fevers or chills.  Was hoping things would be healed by now.  Trying to the patient.  Patient Active Problem List   Diagnosis Date Noted  . 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 Anal fissure 10/18/2013    Past Medical History  Diagnosis Date  . Perianal abscess, right posterior s/o I&D 10/18/2013 10/18/2013  . 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 Incision and drainage perirectal abscess  10/17/2013    History   Social History  . Marital Status: Single    Spouse Name: N/A    Number of Children: N/A  . Years of Education: N/A   Occupational History  . Not on file.   Social History Main Topics  . Smoking status: Never Smoker   . Smokeless tobacco: Never Used  . Alcohol Use: Yes     Comment: occasional  . Drug Use: No  . Sexual Activity: Not on file   Other Topics Concern  . Not on file   Social History Narrative  . No narrative on file    History reviewed. No pertinent family  history.  No current outpatient prescriptions on file.   No current facility-administered medications for this visit.     No Known Allergies  BP 102/70  Pulse 68  Resp 16  Ht 6\' 5"  (1.956 m)  Wt 177 lb (80.287 kg)  BMI 20.98 kg/m2  No results found.   Review of Systems  Constitutional: Negative for fever, chills and diaphoresis.  HENT: Negative for sore throat and trouble swallowing.   Eyes: Negative for photophobia and visual disturbance.  Respiratory: Negative for choking and shortness of breath.   Cardiovascular: Negative for chest pain and palpitations.  Gastrointestinal: Negative for nausea, vomiting, abdominal pain, diarrhea, constipation and abdominal distention.  Genitourinary: Negative for dysuria, urgency, difficulty urinating and testicular pain.  Musculoskeletal: Negative for arthralgias, gait problem, myalgias and neck pain.  Skin: Negative for color change and rash.  Neurological: Negative for dizziness, speech difficulty, weakness and numbness.  Hematological: Negative for adenopathy.  Psychiatric/Behavioral: Negative for hallucinations, confusion and agitation.       Objective:   Physical Exam  Constitutional: He is oriented to person, place, and time. He appears well-developed and well-nourished. No distress.  HENT:  Head: Normocephalic.  Mouth/Throat: Oropharynx is clear and moist. No oropharyngeal exudate.  Eyes: Conjunctivae and EOM are normal. Pupils are equal, round, and reactive to light. No scleral icterus.  Neck: Normal range of motion. No tracheal deviation present.  Cardiovascular: Normal rate, normal  heart sounds and intact distal pulses.   Pulmonary/Chest: Effort normal. No respiratory distress.  Abdominal: Soft. He exhibits no distension. There is no tenderness. Hernia confirmed negative in the ventral area, confirmed negative in the right inguinal area and confirmed negative in the left inguinal area.  Genitourinary: Rectal exam shows  fissure.     Musculoskeletal: Normal range of motion. He exhibits no tenderness.  Neurological: He is alert and oriented to person, place, and time. No cranial nerve deficit. He exhibits normal muscle tone. Coordination normal.  Skin: Skin is warm and dry. No rash noted. He is not diaphoretic.  Psychiatric: He has a normal mood and affect. His behavior is normal.       Assessment:     Healing slowly status post superficial fistulotomy of perianal fistula    Plan:     Continue baths / showers every day.  Keep the area clean and dry.  Dry powder/cottonball/gauze as tolerated.  This should gradually epithelialize over and heal over the next few months  Increase activity as tolerated to regular activity.  Low impact exercise such as walking an hour a day at least ideal.  Do not push through pain.  Diet as tolerated.  Low fat high fiber diet ideal.  Bowel regimen with 30 g fiber a day and fiber supplement as needed to avoid problems.  Return to clinic q3-4 weeks until healed.   Instructions discussed.  Followup with primary care physician for other health issues as would normally be done.  Consider screening for malignancies (breast, prostate, colon, melanoma, etc) as appropriate.  Questions answered.  The patient expressed understanding and appreciation

## 2014-03-10 ENCOUNTER — Encounter (INDEPENDENT_AMBULATORY_CARE_PROVIDER_SITE_OTHER): Payer: BC Managed Care – PPO | Admitting: Surgery

## 2014-03-26 ENCOUNTER — Encounter (INDEPENDENT_AMBULATORY_CARE_PROVIDER_SITE_OTHER): Payer: Self-pay | Admitting: Surgery

## 2014-03-26 ENCOUNTER — Ambulatory Visit (INDEPENDENT_AMBULATORY_CARE_PROVIDER_SITE_OTHER): Payer: BC Managed Care – PPO | Admitting: Surgery

## 2014-03-26 VITALS — BP 100/72 | HR 56 | Temp 97.9°F | Resp 18 | Ht 77.0 in | Wt 177.8 lb

## 2014-03-26 DIAGNOSIS — K603 Anal fistula: Secondary | ICD-10-CM

## 2014-03-26 DIAGNOSIS — K604 Rectal fistula: Secondary | ICD-10-CM

## 2014-03-26 DIAGNOSIS — K602 Anal fissure, unspecified: Secondary | ICD-10-CM

## 2014-03-26 NOTE — Patient Instructions (Signed)
ANORECTAL SURGERY: POST OP INSTRUCTIONS  1. Take your usually prescribed home medications unless otherwise directed. 2. DIET: Follow a light bland diet the first 24 hours after arrival home, such as soup, liquids, crackers, etc.  Be sure to include lots of fluids daily.  Avoid fast food or heavy meals as your are more likely to get nauseated.  Eat a low fat the next few days after surgery.   3. PAIN CONTROL: a. Pain is best controlled by a usual combination of three different methods TOGETHER: i. Ice/Heat ii. Over the counter pain medication iii. Prescription pain medication b. Most patients will experience some swelling and discomfort in the anus/rectal area. and incisions.  Ice packs or heat (30-60 minutes up to 6 times a day) will help. Use ice for the first few days to help decrease swelling and bruising, then switch to heat such as warm towels, sitz baths, warm baths, etc to help relax tight/sore spots and speed recovery.  Some people prefer to use ice alone, heat alone, alternating between ice & heat.  Experiment to what works for you.  Swelling and bruising can take several weeks to resolve.   c. It is helpful to take an over-the-counter pain medication regularly for the first few weeks.  Choose one of the following that works best for you: i. Naproxen (Aleve, etc)  Two 220mg tabs twice a day ii. Ibuprofen (Advil, etc) Three 200mg tabs four times a day (every meal & bedtime) iii. Acetaminophen (Tylenol, etc) 500-650mg four times a day (every meal & bedtime) d. A  prescription for pain medication (such as oxycodone, hydrocodone, etc) should be given to you upon discharge.  Take your pain medication as prescribed.  i. If you are having problems/concerns with the prescription medicine (does not control pain, nausea, vomiting, rash, itching, etc), please call us [phone] to see if we need to switch you to a different pain medicine that will work better for you and/or control your side effect  better. ii. If you need a refill on your pain medication, please contact your pharmacy.  They will contact our office to request authorization. Prescriptions will not be filled after 5 pm or on week-ends.  Use a Sitz Bath 4-8 times a day for relief A sitz bath is a warm water bath taken in the sitting position that covers only the hips and buttocks. It may be used for either healing or hygiene purposes. Sitz baths are also used to relieve pain, itching, or muscle spasms. The water may contain medicine. Moist heat will help you heal and relax.  HOME CARE INSTRUCTIONS  Take 3 to 4 sitz baths a day. 1. Fill the bathtub half full with warm water. 2. Sit in the water and open the drain a little. 3. Turn on the warm water to keep the tub half full. Keep the water running constantly. 4. Soak in the water for 15 to 20 minutes. 5. After the sitz bath, pat the affected area dry first. SEEK MEDICAL CARE IF:  You get worse instead of better. Stop the sitz baths if you get worse.   4. KEEP YOUR BOWELS REGULAR a. The goal is one bowel movement a day b. Avoid getting constipated.  Between the surgery and the pain medications, it is common to experience some constipation.  Increasing fluid intake and taking a fiber supplement (such as Metamucil, Citrucel, FiberCon, MiraLax, etc) 1-2 times a day regularly will usually help prevent this problem from occurring.  A mild   laxative (prune juice, Milk of Magnesia, MiraLax, etc) should be taken according to package directions if there are no bowel movements after 48 hours. c. Watch out for diarrhea.  If you have many loose bowel movements, simplify your diet to bland foods & liquids for a few days.  Stop any stool softeners and decrease your fiber supplement.  Switching to mild anti-diarrheal medications (Kayopectate, Pepto Bismol) can help.  If this worsens or does not improve, please call us.  5. Wound Care a. Remove your bandages the day after surgery.  Unless  discharge instructions indicate otherwise, leave your bandage dry and in place overnight.  Remove the bandage during your first bowel movement.   b. Allow the wound packing to fall out over the next few days.  You can trim exposed gauze / ribbon as it falls out.  You do not need to repack the wound unless instructed otherwise.  Wear an absorbent pad or soft cotton gauze in your underwear as needed to catch any drainage and help keep the area  c. Keep the area clean and dry.  Bathe / shower every day.  Keep the area clean by showering / bathing over the incision / wound.   It is okay to soak an open wound to help wash it.  Wet wipes or showers / gentle washing after bowel movements is often less traumatic than regular toilet paper. d. You may have some styrofoam-like soft packing in the rectum which will come out with the first bowel movement.  e. You will often notice bleeding with bowel movements.  This should slow down by the end of the first week of surgery f. Expect some drainage.  This should slow down, too, by the end of the first week of surgery.  Wear an absorbent pad or soft cotton gauze in your underwear until the drainage stops. 6. ACTIVITIES as tolerated:   a. You may resume regular (light) daily activities beginning the next day-such as daily self-care, walking, climbing stairs-gradually increasing activities as tolerated.  If you can walk 30 minutes without difficulty, it is safe to try more intense activity such as jogging, treadmill, bicycling, low-impact aerobics, swimming, etc. b. Save the most intensive and strenuous activity for last such as sit-ups, heavy lifting, contact sports, etc  Refrain from any heavy lifting or straining until you are off narcotics for pain control.   c. DO NOT PUSH THROUGH PAIN.  Let pain be your guide: If it hurts to do something, don't do it.  Pain is your body warning you to avoid that activity for another week until the pain goes down. d. You may drive when  you are no longer taking prescription pain medication, you can comfortably sit for long periods of time, and you can safely maneuver your car and apply brakes. e. You may have sexual intercourse when it is comfortable.  7. FOLLOW UP in our office a. Please call [facility] at [phone] to set up an appointment to see your surgeon in the office for a follow-up appointment approximately 2 weeks after your surgery. b. Make sure that you call for this appointment the day you arrive home to insure a convenient appointment time. 10. IF YOU HAVE DISABILITY OR FAMILY LEAVE FORMS, BRING THEM TO THE OFFICE FOR PROCESSING.  DO NOT GIVE THEM TO YOUR DOCTOR.        WHEN TO CALL US [phone]: 1. Poor pain control 2. Reactions / problems with new medications (rash/itching, nausea, etc)    3. Fever over 101.5 F (38.5 C) 4. Inability to urinate 5. Nausea and/or vomiting 6. Worsening swelling or bruising 7. Continued bleeding from incision. 8. Increased pain, redness, or drainage from the incision  The clinic staff is available to answer your questions during regular business hours (8:30am-5pm).  Please don't hesitate to call and ask to speak to one of our nurses for clinical concerns.   A surgeon from [facility] is always on call at the hospitals   If you have a medical emergency, go to the nearest emergency room or call 911.    [facility] [address], [address], [place], [place]  [id number] ? MAIN: [phone]) [phone] ? TOLL FREE: [phone] ? FAX [phone] [email or web address]  Anal Pruritus Anal pruritus is an itching of the anus, which is often due to increased moisture of the skin around the anus. Moisture may be due to sweating or a small amount of remaining stool. The itching and scratching can cause further skin damage.  CAUSES   Poor hygiene.  Excessive moisture from sweating or residual stool in the anal area.  Perfumed soaps and  sprays and colored toilet paper.  Chemicals in the foods you eat.  Dietary factors such as caffeine, beer, milk products, chocolate, nuts, citrus fruits, tomatoes, spicy seasonings, jalapeno peppers, and salsa.  Hemorrhoids, infections, and other anal diseases.  Excessive washing.  Overuse of laxatives.  Skin disorders (psoriasis, eczema, or seborrhea). HOME CARE INSTRUCTIONS   Practice good hygiene.  Clean the anal area gently with wet toilet paper, baby wipes, or a wet washcloth after every bowel movement and at bedtime. Avoid using soaps on the anal area. Dry the area thoroughly. Pat the area dry with toilet paper or a towel.  Do not scrub the anal area with anything, even toilet paper.  Try not to scratch the itchy area. Scratching produces more damage, which makes the itching worse.  Take sitz baths in warm water for 15 to 20 minutes, 2 to 3 times a day. Pat the area dry with a soft cloth after each bath.  Zinc oxide ointment or a moisture barrier cream can be applied several times daily to protect the skin.  Only take medicines as directed by your caregiver.  Talk to your caregiver about fiber supplements. These are helpful in normalizing the stool if you have frequent loose stools.  Wear cotton underwear and loose clothing.  Do not use irritants such as bubble baths, scented toilet paper, or genital deodorants. SEEK MEDICAL CARE IF:   Itching does not improve in several days or gets worse.  You have a fever.  There are problems with increased pain, swelling, or redness. MAKE SURE YOU:   Understand these instructions.  Will watch your condition.  Will get help right away if you are not doing well or get worse. Document Released: 03/21/2011 Document Revised: 12/12/2011 Document Reviewed: 03/21/2011 [facility] Patient Information 2015 [employer] [place]. This information is not intended to replace advice given to you by your health care Aquilla Voiles. Make sure you discuss  any questions you have with your health care Sharrell Krawiec.

## 2014-03-26 NOTE — Progress Notes (Signed)
Subjective:     Patient ID: Billy Garcia, male   DOB: 08/12/1991, 23 y.o.   MRN: [id number]  HPI   Note: This dictation was prepared with Dragon/digital dictation along with [facility] technology. Any transcriptional errors that result from this process are unintentional.       Billy Garcia  10/26/1990 [id number]  Patient Care Team: Judith PartMahesh S Sandhu, MD as Referring Physician (Emergency Medicine)  Reason for visit: Pain at anus. Abscess & fissure   Procedure: Incision and drainage of perirectal abscess 10/18/2013  Procedure Examination under anesthesia.  Superficial fistulotomy.  External hemorrhoidectomy 12/12/2013  This patient returns for surgical re-evaluation.  No pain.  Mild blood/drainage  when he wipes.  Drainage minimal.  His bowels are moving okay.  No fevers or chills.    Patient Active Problem List   Diagnosis Date Noted  . 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 Anal fissure 10/18/2013    Past Medical History  Diagnosis Date  . Perianal abscess, right posterior s/o I&D 10/18/2013 10/18/2013  . 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 Incision and drainage perirectal abscess  10/17/2013    History   Social History  . Marital Status: Single    Spouse Name: N/A    Number of Children: N/A  . Years of Education: N/A   Occupational History  . Not on file.   Social History Main Topics  . Smoking status: Never Smoker   . Smokeless tobacco: Never Used  . Alcohol Use: Yes     Comment: occasional  . Drug Use: No  . Sexual Activity: Not on file   Other Topics Concern  . Not on file   Social History Narrative  . No narrative on file    History reviewed. No pertinent family history.  No current outpatient prescriptions on file.   No current facility-administered medications for this visit.     No Known  Allergies  BP 100/72  Pulse 56  Temp(Src) 97.9 F (36.6 C) (Temporal)  Resp 18  Ht 6\' 5"  (1.956 m)  Wt 177 lb 12.8 oz (80.65 kg)  BMI 21.08 kg/m2  No results found.   Review of Systems  Constitutional: Negative for fever, chills and diaphoresis.  HENT: Negative for sore throat and trouble swallowing.   Eyes: Negative for photophobia and visual disturbance.  Respiratory: Negative for choking and shortness of breath.   Cardiovascular: Negative for chest pain and palpitations.  Gastrointestinal: Negative for nausea, vomiting, abdominal pain, diarrhea, constipation and abdominal distention.  Genitourinary: Negative for dysuria, urgency, difficulty urinating and testicular pain.  Musculoskeletal: Negative for arthralgias, gait problem, myalgias and neck pain.  Skin: Negative for color change and rash.  Neurological: Negative for dizziness, speech difficulty, weakness and numbness.  Hematological: Negative for adenopathy.  Psychiatric/Behavioral: Negative for hallucinations, confusion and agitation.       Objective:   Physical Exam  Constitutional: He is oriented to person, place, and time. He appears well-developed and well-nourished. No distress.  HENT:  Head: Normocephalic.  Mouth/Throat: Oropharynx is clear and moist. No oropharyngeal exudate.  Eyes: Conjunctivae and EOM are normal. Pupils are equal, round, and reactive to light. No scleral icterus.  Neck: Normal range of motion. No tracheal deviation present.  Cardiovascular: Normal rate, normal heart sounds and intact distal pulses.   Pulmonary/Chest: Effort normal. No respiratory distress.  Abdominal: Soft. He exhibits no distension. There  is no tenderness. Hernia confirmed negative in the ventral area, confirmed negative in the right inguinal area and confirmed negative in the left inguinal area.  Genitourinary: Rectal exam shows fissure.     Musculoskeletal: Normal range of motion. He exhibits no tenderness.    Neurological: He is alert and oriented to person, place, and time. No cranial nerve deficit. He exhibits normal muscle tone. Coordination normal.  Skin: Skin is warm and dry. No rash noted. He is not diaphoretic.  Psychiatric: He has a normal mood and affect. His behavior is normal.       Assessment:     Healing slowly status post superficial fistulotomy of perianal fistula    Plan:     Continue baths / showers every day.  Keep the area clean and dry.  Dry powder/cottonball/gauze as tolerated.  This should gradually epithelialize over and heal over the next few months  Increase activity as tolerated to regular activity.  Low impact exercise such as walking an hour a day at least ideal.  Do not push through pain.  Diet as tolerated.  Low fat high fiber diet ideal.  Bowel regimen with 30 g fiber a day and fiber supplement as needed to avoid problems.  Return to clinic q4-6 weeks until healed.   Instructions discussed.  Followup with primary care physician for other health issues as would normally be done.  Consider screening for malignancies (breast, prostate, colon, melanoma, etc) as appropriate.  Questions answered.  The patient expressed understanding and appreciation

## 2014-05-07 ENCOUNTER — Encounter (INDEPENDENT_AMBULATORY_CARE_PROVIDER_SITE_OTHER): Payer: Self-pay | Admitting: Surgery

## 2014-05-07 ENCOUNTER — Ambulatory Visit (INDEPENDENT_AMBULATORY_CARE_PROVIDER_SITE_OTHER): Payer: BC Managed Care – PPO | Admitting: Surgery

## 2014-05-07 VITALS — BP 120/80 | HR 76 | Temp 97.6°F | Ht 77.0 in | Wt 178.0 lb

## 2014-05-07 DIAGNOSIS — K602 Anal fissure, unspecified: Secondary | ICD-10-CM

## 2014-05-07 DIAGNOSIS — K604 Rectal fistula: Secondary | ICD-10-CM

## 2014-05-07 DIAGNOSIS — K603 Anal fistula: Secondary | ICD-10-CM

## 2014-05-07 MED ORDER — AMBULATORY NON FORMULARY MEDICATION: 1.0000 "application " | Freq: Four times a day (QID) | Status: DC

## 2014-05-07 NOTE — Patient Instructions (Addendum)
ANORECTAL SURGERY: POST OP INSTRUCTIONS  1. Take your usually prescribed home medications unless otherwise directed. 2. DIET: Follow a light bland diet the first 24 hours after arrival home, such as soup, liquids, crackers, etc.  Be sure to include lots of fluids daily.  Avoid fast food or heavy meals as your are more likely to get nauseated.  Eat a low fat the next few days after surgery.   3. PAIN CONTROL: a. Pain is best controlled by a usual combination of three different methods TOGETHER: i. Ice/Heat ii. Over the counter pain medication iii. Prescription pain medication b. Most patients will experience some swelling and discomfort in the anus/rectal area. and incisions.  Ice packs or heat (30-60 minutes up to 6 times a day) will help. Use ice for the first few days to help decrease swelling and bruising, then switch to heat such as warm towels, sitz baths, warm baths, etc to help relax tight/sore spots and speed recovery.  Some people prefer to use ice alone, heat alone, alternating between ice & heat.  Experiment to what works for you.  Swelling and bruising can take several weeks to resolve.   c. It is helpful to take an over-the-counter pain medication regularly for the first few weeks.  Choose one of the following that works best for you: i. Naproxen (Aleve, etc)  Two 220mg tabs twice a day ii. Ibuprofen (Advil, etc) Three 200mg tabs four times a day (every meal & bedtime) iii. Acetaminophen (Tylenol, etc) 500-650mg four times a day (every meal & bedtime) d. A  prescription for pain medication (such as oxycodone, hydrocodone, etc) should be given to you upon discharge.  Take your pain medication as prescribed.  i. If you are having problems/concerns with the prescription medicine (does not control pain, nausea, vomiting, rash, itching, etc), please call us [phone] to see if we need to switch you to a different pain medicine that will work better for you and/or control your side effect  better. ii. If you need a refill on your pain medication, please contact your pharmacy.  They will contact our office to request authorization. Prescriptions will not be filled after 5 pm or on week-ends.  Use a Sitz Bath 4-8 times a day for relief A sitz bath is a warm water bath taken in the sitting position that covers only the hips and buttocks. It may be used for either healing or hygiene purposes. Sitz baths are also used to relieve pain, itching, or muscle spasms. The water may contain medicine. Moist heat will help you heal and relax.  HOME CARE INSTRUCTIONS  Take 3 to 4 sitz baths a day. 1. Fill the bathtub half full with warm water. 2. Sit in the water and open the drain a little. 3. Turn on the warm water to keep the tub half full. Keep the water running constantly. 4. Soak in the water for 15 to 20 minutes. 5. After the sitz bath, pat the affected area dry first. SEEK MEDICAL CARE IF:  You get worse instead of better. Stop the sitz baths if you get worse.   4. KEEP YOUR BOWELS REGULAR a. The goal is one bowel movement a day b. Avoid getting constipated.  Between the surgery and the pain medications, it is common to experience some constipation.  Increasing fluid intake and taking a fiber supplement (such as Metamucil, Citrucel, FiberCon, MiraLax, etc) 1-2 times a day regularly will usually help prevent this problem from occurring.  A mild   laxative (prune juice, Milk of Magnesia, MiraLax, etc) should be taken according to package directions if there are no bowel movements after 48 hours. c. Watch out for diarrhea.  If you have many loose bowel movements, simplify your diet to bland foods & liquids for a few days.  Stop any stool softeners and decrease your fiber supplement.  Switching to mild anti-diarrheal medications (Kayopectate, Pepto Bismol) can help.  If this worsens or does not improve, please call us.  5. Wound Care a. Remove your bandages the day after surgery.  Unless  discharge instructions indicate otherwise, leave your bandage dry and in place overnight.  Remove the bandage during your first bowel movement.   b. Allow the wound packing to fall out over the next few days.  You can trim exposed gauze / ribbon as it falls out.  You do not need to repack the wound unless instructed otherwise.  Wear an absorbent pad or soft cotton gauze in your underwear as needed to catch any drainage and help keep the area  c. Keep the area clean and dry.  Bathe / shower every day.  Keep the area clean by showering / bathing over the incision / wound.   It is okay to soak an open wound to help wash it.  Wet wipes or showers / gentle washing after bowel movements is often less traumatic than regular toilet paper. d. You may have some styrofoam-like soft packing in the rectum which will come out with the first bowel movement.  e. You will often notice bleeding with bowel movements.  This should slow down by the end of the first week of surgery f. Expect some drainage.  This should slow down, too, by the end of the first week of surgery.  Wear an absorbent pad or soft cotton gauze in your underwear until the drainage stops. 6. ACTIVITIES as tolerated:   a. You may resume regular (light) daily activities beginning the next day-such as daily self-care, walking, climbing stairs-gradually increasing activities as tolerated.  If you can walk 30 minutes without difficulty, it is safe to try more intense activity such as jogging, treadmill, bicycling, low-impact aerobics, swimming, etc. b. Save the most intensive and strenuous activity for last such as sit-ups, heavy lifting, contact sports, etc  Refrain from any heavy lifting or straining until you are off narcotics for pain control.   c. DO NOT PUSH THROUGH PAIN.  Let pain be your guide: If it hurts to do something, don't do it.  Pain is your body warning you to avoid that activity for another week until the pain goes down. d. You may drive when  you are no longer taking prescription pain medication, you can comfortably sit for long periods of time, and you can safely maneuver your car and apply brakes. e. You may have sexual intercourse when it is comfortable.  7. FOLLOW UP in our office a. Please call [facility] at [phone] to set up an appointment to see your surgeon in the office for a follow-up appointment approximately 2 weeks after your surgery. b. Make sure that you call for this appointment the day you arrive home to insure a convenient appointment time. 10. IF YOU HAVE DISABILITY OR FAMILY LEAVE FORMS, BRING THEM TO THE OFFICE FOR PROCESSING.  DO NOT GIVE THEM TO YOUR DOCTOR.        WHEN TO CALL US [phone]: 1. Poor pain control 2. Reactions / problems with new medications (rash/itching, nausea, etc)    3. Fever over 101.5 F (38.5 C) 4. Inability to urinate 5. Nausea and/or vomiting 6. Worsening swelling or bruising 7. Continued bleeding from incision. 8. Increased pain, redness, or drainage from the incision  The clinic staff is available to answer your questions during regular business hours (8:30am-5pm).  Please don't hesitate to call and ask to speak to one of our nurses for clinical concerns.   [facility] surgeon [facility] is always on call at the hospitals   If you have a medical emergency, go to the nearest emergency room or call 911.    [facility] [address], [address], [place], [place]  [id number] ? MAIN: [phone]) [phone] ? TOLL FREE: [phone] ? FAX [phone] [email or web address]  WHAT IS AN ANAL FISSURE? An anal fissure (fissure-in-ano) is a small, oval shaped tear in skin that lines the opening of the anus. Fissures typically cause severe pain and bleeding with bowel movements. Fissures are quite common in the general population, but are often confused with other causes of pain and bleeding, such as hemorrhoids.  WHAT ARE THE SYMPTOMS OF AN  ANAL FISSURE? The typical symptoms of an anal fissure include severe pain during, and especially after, a bowel movement, lasting from several minutes to a few hours. Patients may also notice bright red blood from the anus that can be seen on the toilet paper or on the stool. Between bowel movements, patients with anal fissures are often relatively symptom-free. Many patients are fearful of having a bowel movement and may try to avoid defecation secondary to the pain.   WHAT CAUSES AN ANAL FISSURE? Fissures are usually caused by trauma to the inner lining of the anus. Patients with tight anal sphincter muscles (i.e., increased muscle tone) are more prone to developing anal fissures. A hard, dry bowel movement is typically responsible, but loose stools and diarrhea can also be the cause. Following a bowel movement, severe anal pain can produce spasm of the anal sphincter muscle, resulting in a decrease in blood flow to the site of the injury, thus impairing healing of the wound. The next bowel movement results in more pain, anal spasm, decreased blood flow to the area, and the cycle continues. Treatments are aimed at interrupting this cycle by relaxing the anal sphincter muscle to promote healing of the fissure.   Other, less common, causes include inflammatory conditions and certain anal infections or tumors. Anal fissures may be acute (recent onset) or chronic (present for a long period of time). Chronic fissures may be more difficult to treat, and may also have an external lump associated with the tear, called a sentinel pile or skin tag, as well as extra tissue just inside the anal canal (hypertrophied papilla) .  WHAT IS THE TREATMENT OF ANAL FISSURES? The majority of anal fissures do not require surgery. The most common treatment for an acute anal fissure consists of making the stool more formed and bulky with a diet high in fiber and utilization of over-the-counter fiber supplementation (totaling  25-35 grams of fiber/day). Stool softeners and increasing water intake may be necessary to promote soft bowel movements and aid in the healing process. Topical anesthetics for pain and warm tub baths (sitz baths) for 10-20 minutes several times a day (especially after bowel movements) are soothing and promote relaxation of the anal muscles, which may help the healing process.  Other medications (such as diltiazem) may be prescribed that allow relaxation of the anal sphincter muscles. Your surgeon will  go over benefits and side-effects of each of these with you. Narcotic pain medications are not recommended for anal fissures, as they promote constipation. Chronic fissures are generally more difficult to treat, and your surgeon may advise surgical treatment.  WILL THE PROBLEM RETURN? Fissures can recur easily, and it is quite common for a fully healed fissure to recur after a hard bowel movement or other trauma. Even when the pain and bleeding have subsided, it is very important to continue good bowel habits and a diet high in fiber as a lifestyle change. If the problem returns without an obvious cause, further assessment is warranted.  GETTING TO GOOD BOWEL HEALTH. Irregular bowel habits such as constipation and diarrhea can lead to many problems over time.  Having one soft bowel movement a day is the most important way to prevent further problems.  The anorectal canal is designed to handle stretching and feces to safely manage our ability to get rid of solid waste (feces, poop, stool) out of our body.  BUT, hard constipated stools can act like ripping concrete bricks and diarrhea can be a burning fire to this very sensitive area of our body, causing inflamed hemorrhoids, anal fissures, increasing risk is perirectal abscesses, abdominal pain/bloating, an making irritable bowel worse.     The goal: ONE SOFT BOWEL MOVEMENT A DAY!  To have soft, regular bowel movements:    Drink at least 8 tall glasses of water  a day.     Take plenty of fiber.  Fiber is the undigested part of plant food that passes into the colon, acting s "natures broom" to encourage bowel motility and movement.  Fiber can absorb and hold large amounts of water. This results in a larger, bulkier stool, which is soft and easier to pass. Work gradually over several weeks up to 6 servings a day of fiber (25g a day even more if needed) in the form of: o Vegetables -- Root (potatoes, carrots, turnips), leafy green (lettuce, salad greens, celery, spinach), or cooked high residue (cabbage, broccoli, etc) o Fruit -- Fresh (unpeeled skin & pulp), Dried (prunes, apricots, cherries, etc ),  or stewed ( applesauce)  o Whole grain breads, pasta, etc (whole wheat)  o Bran cereals    Bulking Agents -- This type of water-retaining fiber generally is easily obtained each day by one of the following:  o Psyllium bran -- The psyllium plant is remarkable because its ground seeds can retain so much water. This product is available as Metamucil, Konsyl, Effersyllium, Per Diem Fiber, or the less expensive generic preparation in drug and health food stores. Although labeled a laxative, it really is not a laxative.  o Methylcellulose -- This is another fiber derived from wood which also retains water. It is available as Citrucel. o Polyethylene Glycol - and "artificial" fiber commonly called Miralax or Glycolax.  It is helpful for people with gassy or bloated feelings with regular fiber o Flax Seed - a less gassy fiber than psyllium   No reading or other relaxing activity while on the toilet. If bowel movements take longer than 5 minutes, you are too constipated   AVOID CONSTIPATION.  High fiber and water intake usually takes care of this.  Sometimes a laxative is needed to stimulate more frequent bowel movements, but    Laxatives are not a good long-term solution as it can wear the colon out. o Osmotics (Milk of Magnesia, Fleets phosphosoda, Magnesium citrate,  MiraLax, GoLytely) are safer than  o Stimulants (  Senokot, Castor Oil, Dulcolax, Ex Lax)    o Do not take laxatives for more than 7days in a row.    IF SEVERELY CONSTIPATED, try a Bowel Retraining Program: o Do not use laxatives.  o Eat a diet high in roughage, such as bran cereals and leafy vegetables.  o Drink six (6) ounces of prune or apricot juice each morning.  o Eat two (2) large servings of stewed fruit each day.  o Take one (1) heaping tablespoon of a psyllium-based bulking agent twice a day. Use sugar-free sweetener when possible to avoid excessive calories.  o Eat a normal breakfast.  o Set aside 15 minutes after breakfast to sit on the toilet, but do not strain to have a bowel movement.  o If you do not have a bowel movement by the third day, use an enema and repeat the above steps.    Controlling diarrhea o Switch to liquids and simpler foods for a few days to avoid stressing your intestines further. o Avoid dairy products (especially milk & ice cream) for a short time.  The intestines often can lose the ability to digest lactose when stressed. o Avoid foods that cause gassiness or bloating.  Typical foods include beans and other legumes, cabbage, broccoli, and dairy foods.  Every person has some sensitivity to other foods, so listen to our body and avoid those foods that trigger problems for you. o Adding fiber (Citrucel, Metamucil, psyllium, Miralax) gradually can help thicken stools by absorbing excess fluid and retrain the intestines to act more normally.  Slowly increase the dose over a few weeks.  Too much fiber too soon can backfire and cause cramping & bloating. o Probiotics (such as active yogurt, Align, etc) may help repopulate the intestines and colon with normal bacteria and calm down a sensitive digestive tract.  Most studies show it to be of mild help, though, and such products can be costly. o Medicines:   Bismuth subsalicylate (ex. Kayopectate, Pepto Bismol) every 30  minutes for up to 6 doses can help control diarrhea.  Avoid if pregnant.   Loperamide (Immodium) can slow down diarrhea.  Start with two tablets (4mg  total) first and then try one tablet every 6 hours.  Avoid if you are having fevers or severe pain.  If you are not better or start feeling worse, stop all medicines and call your doctor for advice o Call your doctor if you are getting worse or not better.  Sometimes further testing (cultures, endoscopy, X-ray studies, bloodwork, etc) may be needed to help diagnose and treat the cause of the diarrhea. o   WHAT CAN BE DONE IF THE FISSURE DOES NOT HEAL? A fissure that fails to respond to conservative measures should be re-examined. Persistent hard or loose bowel movements, scarring, or spasm of the internal anal muscle all contribute to delayed healing. Other medical problems such as inflammatory bowel disease (Crohn's disease), infections, or anal tumors can cause symptoms similar to anal fissures. Patients suffering from persistent anal pain should be examined to exclude these symptoms. This may include a colonoscopy or an exam in the operating room under anesthesia.  WHAT DOES SURGERY INVOLVE? Surgical options for treating anal fissure include Botulinum toxin (Botox) injection into the anal sphincter and surgical division of a portion of the internal anal sphincter (lateral internal sphincterotomy). Both of these are performed typically as outpatient, same-day procedures, or occasionally in the office setting. The goal of these surgical options is to promote relaxation of the  anal sphincter, thereby decreasing anal pain and spasm, allowing the fissure to heal. Botox injection results in healing in 50-80% of patients, while sphincterotomy is reported to be over 90% successful. If a sentinel pile is present, it may be removed to promote healing of the fissure. All surgical procedures carry some risk, and a sphincterotomy can rarely interfere with one's  ability to control gas and stool. Your colon and rectal surgeon will discuss these risks with you to determine the appropriate treatment for your particular situation.  HOW LONG IS THE RECOVERY AFTER SURGERY? It is important to note that complete healing with both medical and surgical treatments can take up to approximately 6-10 weeks. However, acute pain after surgery often disappears after a few days. Most patients will be able to return to work and resume daily activities in a few short days after the surgery.  CAN FISSURES LEAD TO COLON CANCER? Absolutely not. Persistent symptoms, however, need careful evaluation since other conditions other than an anal fissure can cause similar symptoms. Your colon and rectal surgeon may request additional tests, even if your fissure has successfully healed. A colonoscopy may be required to exclude other causes of rectal bleeding.  Chest Pain (Nonspecific) It is often hard to give a specific diagnosis for the cause of chest pain. There is always a chance that your pain could be related to something serious, such as a heart attack or a blood clot in the lungs. You need to follow up with your health care provider for further evaluation. CAUSES   Heartburn.  Pneumonia or bronchitis.  Anxiety or stress.  Inflammation around your heart (pericarditis) or lung (pleuritis or pleurisy).  A blood clot in the lung.  A collapsed lung (pneumothorax). It can develop suddenly on its own (spontaneous pneumothorax) or from trauma to the chest.  Shingles infection (herpes zoster virus). The chest wall is composed of bones, muscles, and cartilage. Any of these can be the source of the pain.  The bones can be bruised by injury.  The muscles or cartilage can be strained by coughing or overwork.  The cartilage can be affected by inflammation and become sore (costochondritis). DIAGNOSIS  Lab tests or other studies may be needed to find the cause of your pain. Your  health care provider may have you take a test called an ambulatory electrocardiogram (ECG). An ECG records your heartbeat patterns over a 24-hour period. You may also have other tests, such as:  Transthoracic echocardiogram (TTE). During echocardiography, sound waves are used to evaluate how blood flows through your heart.  Transesophageal echocardiogram (TEE).  Cardiac monitoring. This allows your health care provider to monitor your heart rate and rhythm in real time.  Holter monitor. This is a portable device that records your heartbeat and can help diagnose heart arrhythmias. It allows your health care provider to track your heart activity for several days, if needed.  Stress tests by exercise or by giving medicine that makes the heart beat faster. TREATMENT   Treatment depends on what may be causing your chest pain. Treatment may include:  Acid blockers for heartburn.  Anti-inflammatory medicine.  Pain medicine for inflammatory conditions.  Antibiotics if an infection is present.  You may be advised to change lifestyle habits. This includes stopping smoking and avoiding alcohol, caffeine, and chocolate.  You may be advised to keep your head raised (elevated) when sleeping. This reduces the chance of acid going backward from your stomach into your esophagus. Most of the time, nonspecific chest  pain will improve within 2-3 days with rest and mild pain medicine.  HOME CARE INSTRUCTIONS   If antibiotics were prescribed, take them as directed. Finish them even if you start to feel better.  For the next few days, avoid physical activities that bring on chest pain. Continue physical activities as directed.  Do not use any tobacco products, including cigarettes, chewing tobacco, or electronic cigarettes.  Avoid drinking alcohol.  Only take medicine as directed by your health care provider.  Follow your health care provider's suggestions for further testing if your chest pain does  not go away.  Keep any follow-up appointments you made. If you do not go to an appointment, you could develop lasting (chronic) problems with pain. If there is any problem keeping an appointment, call to reschedule. SEEK MEDICAL CARE IF:   Your chest pain does not go away, even after treatment.  You have a rash with blisters on your chest.  You have a fever. SEEK IMMEDIATE MEDICAL CARE IF:   You have increased chest pain or pain that spreads to your arm, neck, jaw, back, or abdomen.  You have shortness of breath.  You have an increasing cough, or you cough up blood.  You have severe back or abdominal pain.  You feel nauseous or vomit.  You have severe weakness.  You faint.  You have chills. This is an emergency. Do not wait to see if the pain will go away. Get medical help at once. Call your local emergency services (911 in [place] Do not drive yourself to the hospital. MAKE SURE YOU:   Understand these instructions.  Will watch your condition.  Will get help right away if you are not doing well or get worse. Document Released: 06/29/2005 Document Revised: 09/24/2013 Document Reviewed: 04/24/2008 [facility] Patient Information 2015 [place], [place]. This information is not intended to replace advice given to you by your health care provider. Make sure you discuss any questions you have with your health care provider.

## 2014-05-07 NOTE — Progress Notes (Signed)
Subjective:     Patient ID: Billy Garcia, male   DOB: 08/04/1991, 23 y.o.   MRN: [id number]  HPI   Note: This dictation was prepared with Dragon/digital dictation along with [facility] technology. Any transcriptional errors that result from this process are unintentional.       Billy Garcia  07/26/1991 [id number]  Patient Care Team: Billy PartMahesh S Sandhu, MD as Referring Physician (Emergency Medicine)  Reason for visit: Pain at anus. Abscess & fissure   Procedure: Incision and drainage of perirectal abscess 10/18/2013  Procedure Examination under anesthesia.  Superficial fistulotomy.  External hemorrhoidectomy 12/12/2013  This patient returns for surgical re-evaluation.  No pain.  Mild blood/drainage  when he wipes.  Drainage minimal.  His bowels are moving okay.  No fevers or chills.    Patient Active Problem List   Diagnosis Date Noted  . 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 Anal fissure 10/18/2013    Past Medical History  Diagnosis Date  . Perianal abscess, right posterior s/o I&D 10/18/2013 10/18/2013  . 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 Incision and drainage perirectal abscess  10/17/2013    History   Social History  . Marital Status: Single    Spouse Name: N/A    Number of Children: N/A  . Years of Education: N/A   Occupational History  . Not on file.   Social History Main Topics  . Smoking status: Never Smoker   . Smokeless tobacco: Never Used  . Alcohol Use: Yes     Comment: occasional  . Drug Use: No  . Sexual Activity: Not on file   Other Topics Concern  . Not on file   Social History Narrative  . No narrative on file    History reviewed. No pertinent family history.  No current outpatient prescriptions on file.   No current facility-administered medications for this visit.     No Known  Allergies  BP 120/80  Pulse 76  Temp(Src) 97.6 F (36.4 C)  Ht 6\' 5"  (1.956 m)  Wt 178 lb (80.74 kg)  BMI 21.10 kg/m2  No results found.   Review of Systems  Constitutional: Negative for fever, chills and diaphoresis.  HENT: Negative for sore throat and trouble swallowing.   Eyes: Negative for photophobia and visual disturbance.  Respiratory: Negative for choking and shortness of breath.   Cardiovascular: Negative for chest pain and palpitations.  Gastrointestinal: Negative for nausea, vomiting, abdominal pain, diarrhea, constipation and abdominal distention.  Genitourinary: Negative for dysuria, urgency, difficulty urinating and testicular pain.  Musculoskeletal: Negative for arthralgias, gait problem, myalgias and neck pain.  Skin: Negative for color change and rash.  Neurological: Negative for dizziness, speech difficulty, weakness and numbness.  Hematological: Negative for adenopathy.  Psychiatric/Behavioral: Negative for hallucinations, confusion and agitation.       Objective:   Physical Exam  Constitutional: He is oriented to person, place, and time. He appears well-developed and well-nourished. No distress.  HENT:  Head: Normocephalic.  Mouth/Throat: Oropharynx is clear and moist. No oropharyngeal exudate.  Eyes: Conjunctivae and EOM are normal. Pupils are equal, round, and reactive to light. No scleral icterus.  Neck: Normal range of motion. No tracheal deviation present.  Cardiovascular: Normal rate, regular rhythm, intact distal pulses and normal pulses.  Exam reveals gallop. Exam reveals no friction rub.   No murmur heard. Pulmonary/Chest: Effort normal and breath sounds normal.  No accessory muscle usage. Not tachypneic and not bradypneic. No respiratory distress. He has no decreased breath sounds. He has no wheezes. He has no rhonchi.  Abdominal: Soft. He exhibits no distension. There is no tenderness. Hernia confirmed negative in the ventral area, confirmed  negative in the right inguinal area and confirmed negative in the left inguinal area.  Genitourinary: Rectal exam shows fissure.     Exam done with assistance of male Medical Assistant in the room.  Perianal skin clean with good hygiene.  No pruritis.  No pilonidal disease.   Posterior midline fissure small.   Mildly increased sphincter tone - baseline.   Musculoskeletal: Normal range of motion. He exhibits no tenderness.       Cervical back: Normal.       Thoracic back: Normal.       Lumbar back: Normal.  Neurological: He is alert and oriented to person, place, and time. No cranial nerve deficit. He exhibits normal muscle tone. Coordination normal.  Skin: Skin is warm and dry. No rash noted. He is not diaphoretic.  Psychiatric: He has a normal mood and affect. His behavior is normal.       Assessment:     Healed slowly status post superficial fistulotomy of perianal fistula  Persistent anal fissure  No evidence of MI/PNA in [name] active male - most likely panic attack/anxiety - better    Plan:     Continue baths / showers every day.  Keep the area clean and dry.  Dry powder/cottonball/gauze as tolerated.  This should gradually epithelialize over and heal over the next few months.    The douching small and mildly symptomatic.  Warm soaks and bowel regimen.  Consider diltiazem cream to help the sphincter relax & allow the fissure to finally heal: The anatomy & physiology of the anorectal region was discussed.  The pathophysiology of anal fissure and differential diagnosis was discussed.  Natural history progression  was discussed.   I stressed the importance of a bowel regimen to have daily soft bowel movements to minimize progression of disease.   I discussed the use of warm soaks &  muscle relaxant, diltiazem, to help the anal sphincter relax, allow the spasming to stop, and help the tear/fissure to heal.  If non-operative treatment does not heal the fissure, I would recommend  examination under anesthesia for better examination to confirm the diagnosis and treat by lateral internal sphincterotomy to allow the fissure to heal.  Technique, benefits, alternatives discussed.  Risks such as bleeding, pain, incontinence, recurrence, heart attack, death, and other risks were discussed.    Educational handouts further explaining the pathology, treatment options, and bowel regimen were given as well.  The patient expressed understanding & will follow up PRN.  If the pain does not resolve in a few weeks or worsens, he should proceed with surgery.  Regular activity.  Low impact exercise such as walking an hour a day at least ideal.  Do not push through pain.  Diet as tolerated.  Low fat high fiber diet ideal.  Bowel regimen with 30 g fiber a day and fiber supplement as needed to avoid problems.  Return to clinic in 6 weeks to follow fissure per his request.   Instructions discussed.   I think he most likely has some anxiety/panic attack.  No evidence of any cardiopulmonary issues and otherwise healthy active male.  Followup with primary care physician for other health issues as would normally be done, especially if chest pain recurs  or worsens.   Consider screening for malignancies (breast, prostate, colon, melanoma, etc) as appropriate.  Questions answered.  The patient expressed understanding and appreciation

## 2014-06-18 ENCOUNTER — Encounter (INDEPENDENT_AMBULATORY_CARE_PROVIDER_SITE_OTHER): Payer: BC Managed Care – PPO | Admitting: Surgery

## 2014-08-15 ENCOUNTER — Ambulatory Visit (INDEPENDENT_AMBULATORY_CARE_PROVIDER_SITE_OTHER): Payer: BC Managed Care – PPO | Admitting: Family Medicine

## 2014-08-15 VITALS — BP 102/52 | HR 80 | Temp 98.5°F | Resp 16 | Ht 75.75 in | Wt 181.0 lb

## 2014-08-15 DIAGNOSIS — G44309 Post-traumatic headache, unspecified, not intractable: Secondary | ICD-10-CM

## 2014-08-15 DIAGNOSIS — S0990XA Unspecified injury of head, initial encounter: Secondary | ICD-10-CM
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
## 2014-08-15 NOTE — Patient Instructions (Signed)
Take Aleve (naproxen) 220 mg 2 pills twice daily with breakfast and supper.  Avoid excessive stimulants  Try to get more sleep  If you're not doing better in the next 2 or 3 weeks return for a recheck.

## 2014-09-16 ENCOUNTER — Other Ambulatory Visit (INDEPENDENT_AMBULATORY_CARE_PROVIDER_SITE_OTHER): Payer: Self-pay | Admitting: Surgery

## 2014-09-17 ENCOUNTER — Ambulatory Visit (INDEPENDENT_AMBULATORY_CARE_PROVIDER_SITE_OTHER): Payer: BC Managed Care – PPO | Admitting: Physician Assistant

## 2014-09-17 VITALS — BP 102/54 | HR 73 | Temp 99.0°F | Resp 16 | Ht 76.0 in | Wt 175.6 lb

## 2014-09-17 DIAGNOSIS — Z0000 Encounter for general adult medical examination without abnormal findings: Secondary | ICD-10-CM

## 2014-09-17 DIAGNOSIS — Z23 Encounter for immunization: Secondary | ICD-10-CM

## 2014-09-17 DIAGNOSIS — R519 Headache, unspecified: Secondary | ICD-10-CM

## 2014-09-17 DIAGNOSIS — R51 Headache: Secondary | ICD-10-CM
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
## 2014-09-18 ENCOUNTER — Encounter: Payer: Self-pay | Admitting: Sports Medicine

## 2014-09-18 ENCOUNTER — Ambulatory Visit (INDEPENDENT_AMBULATORY_CARE_PROVIDER_SITE_OTHER): Payer: BC Managed Care – PPO | Admitting: Sports Medicine

## 2014-09-18 VITALS — BP 104/70 | HR 58 | Ht 76.0 in | Wt 175.0 lb

## 2014-09-18 DIAGNOSIS — M25562 Pain in left knee: Secondary | ICD-10-CM | POA: Diagnosis not present

## 2014-09-18 DIAGNOSIS — M25561 Pain in right knee: Secondary | ICD-10-CM | POA: Diagnosis not present

## 2014-09-18 NOTE — Progress Notes (Signed)
   Subjective:    Patient ID: Billy Garcia, male    DOB: 04/10/1991, 23 y.o.   MRN: [id number]  [facility]  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 No significant previous history of prior knee injuries, surgeries or problems. - Currently complains of significantly worsened L>R knee pain x 1 month, described as "sharp pain" generalized area within anterior knee mostly below patella. Denies any fall or injury. No associated symptoms, without effusion, weakness, numbness or tingling. Experienced some discomfort while riding longboard (often stress is placed on single flexed L leg) however worse after prolonged riding, or even prolonged sitting at computer, pain gradually improves after later and relieved by rest. Has not taken any medications, stretching/exercises, ice or heat for this problem. Most improved after stopped longboarding x 1 week.  I have reviewed and updated the following as appropriate: allergies and current medications  Social Hx: - Never smoker  Review of Systems  See above HPI    Objective:   Physical Exam  BP 104/70 mmHg  Pulse 58  Ht 6\' 4"  (1.93 m)  Wt 175 lb (79.379 kg)  BMI 21.31 kg/m2  Gen - well-appearing, NAD MSK: - Bilateral Knees: normal appearance, symmetrical, no effusion, full active ROM b/l, mild focal tenderness Right knee patellar tendon, no significant crepitus or grinding with patellar motion, structurally intact with negative ligamentous testing (ACL, MCL, LCL), no joint line pain, McMurray's negative b/l, muscle strength intact 5/5 hip flex, hip adductors, knee flex / ext, ankle. Ext - no edema, peripheral pulses intact +2 b/l Neuro - intact distal sensation to light touch, gait normal  Bedside Ultrasound: - Bilateral Knees: normal bilateral patellas without bony injury, normal  quad and patellar tendons without evidence of effusion or inflammation, Right > Left small are with hypoechoic change within patella suggesting repetitive stress on patella     Assessment & Plan:   23 yr M without significant PMH, active regular long-boarder for transportation, presents with acute on chronic bilateral knee pain (R>L), mostly localized to patellar tendon. Exam and ultrasound suggestive of repetitive stress on R-patella likely due to long-boarding, consistent with early / mild quad and patellar tendonitis.  Plan: 1. Bilateral Cho-Pat straps to support patella while long-boarding, avoid repetitive stress 2. Handout given for lower ext str exercises, focus on decline squats 3. May do conservative NSAIDs, relative rest, RICE as needed for flares 4. RTC PRN  Billy PilarAlexander Karamalegos, DO [facility], PGY-2  Agree with plan and assessment   Billy BigKB Fields, MD

## 2014-09-18 NOTE — Assessment & Plan Note (Signed)
23 yr M without significant PMH, active regular long-boarder for transportation, presents with acute on chronic bilateral knee pain (R>L), mostly localized to patellar tendon. Exam and ultrasound suggestive of repetitive stress on R-patella likely due to long-boarding, consistent with early / mild quad and patellar tendonitis.   Plan:  1. Bilateral Cho-Pat straps to support patella while long-boarding, avoid repetitive stress  2. Handout given for lower ext str exercises, focus on decline squats  3. May do conservative NSAIDs, relative rest, RICE as needed for flares  4. RTC PRN

## 2014-11-01 ENCOUNTER — Ambulatory Visit (INDEPENDENT_AMBULATORY_CARE_PROVIDER_SITE_OTHER): Payer: BC Managed Care – PPO | Admitting: Family Medicine

## 2014-11-01 VITALS — BP 124/78 | HR 79 | Temp 99.2°F | Resp 16 | Ht 76.0 in | Wt 179.0 lb

## 2014-11-01 DIAGNOSIS — N509 Disorder of male genital organs, unspecified: Secondary | ICD-10-CM

## 2014-11-01 DIAGNOSIS — Z711 Person with feared health complaint in whom no diagnosis is made: Secondary | ICD-10-CM

## 2014-11-01 NOTE — Progress Notes (Signed)
Subjective: 24 year old male whom I have seen before. He has recently noticed a fullness area in the left scrotum that was concerning him. He had seen something on YouTube which may have initiated his concerns, but that he started feeling like there is discomfort there. He is planning to go studying abroad [place] next month for 4 months, and was fearful this would make it difficult for him to do so. There is no family history of genital disease that he knows of.  Objective: Normal male external genitalia testes descended. No hernias. Has a little prominence of the epididymis and collecting cube area at the top of the left testicle. May be slightly more prominent than the right. No mass. No abnormal tenderness.  Assessment: Testicular concern, anxious but normal.  Plan: Return when necessary

## 2014-11-01 NOTE — Patient Instructions (Signed)
If you notice anything of more concern please get rechecked  In [place] there is very good health care. [facility], and the head doctor of it is Dr. Daneen SchickJohn Linton.  Tell them you are a patient of mine and I am a friend of John's, and that may help you get on in to him.

## 2019-03-04 ENCOUNTER — Other Ambulatory Visit: Payer: Self-pay

## 2019-03-05 ENCOUNTER — Encounter: Payer: Self-pay | Admitting: Gastroenterology

## 2019-03-05 ENCOUNTER — Other Ambulatory Visit: Payer: Self-pay

## 2019-03-05 ENCOUNTER — Ambulatory Visit (INDEPENDENT_AMBULATORY_CARE_PROVIDER_SITE_OTHER): Payer: Self-pay | Admitting: Gastroenterology

## 2019-03-05 VITALS — Ht 75.0 in | Wt 200.0 lb

## 2019-03-05 DIAGNOSIS — K219 Gastro-esophageal reflux disease without esophagitis: Secondary | ICD-10-CM

## 2019-03-05 DIAGNOSIS — R1013 Epigastric pain: Secondary | ICD-10-CM

## 2019-03-05 MED ORDER — OMEPRAZOLE 40 MG PO CPDR: 40.0000 mg | DELAYED_RELEASE_CAPSULE | Freq: Every day | ORAL | 1 refills | Status: DC

## 2019-03-05 NOTE — Progress Notes (Signed)
This patient contacted our office requesting a physician telemedicine consultation regarding clinical questions and/or test results.  If new patient, they were referred by self  Participants on the conference : myself and patient   The patient consented to this consultation and was aware that a charge will be placed through their insurance.  I was in my office and the patient was at home   Encounter time:  Total time 30 minutes, with 23 minutes spent with patient on Doximity   _____________________________________________________________________________________________              [facility] Consult Note:  History: Billy Garcia 03/05/2019  Referring provider: Patient, No Pcp Per  Reason for consult/chief complaint: Abdominal Pain; Bloated; and Chest Pain (from reflux )   Subjective  HPI:  This is a very pleasant 28 year old man self-referred for 2 to 3 months of epigastric pain and reflux.  Since the onset of COVID related quarantine, he has had epigastric burning discomfort with regurgitation and sour brash, bloating intermittent feeling of "lump in the throat". He has had episodes of anxiety and panic attacks in the past, and feels that recent stressors have been worsening the symptoms.  He denies food being lodged in the esophagus, nausea, vomiting, early satiety or weight loss. His schedule is been variable, he is working just a few hours a few days a week, he is eating at odd hours, including late in the evening.  Billy Garcia recalled trying Tums, Pepto-Bismol and what sounds like Mylanta with insufficient symptom relief. Rare NSAID use.  ROS:  Review of Systems  He denies dyspnea, cough, fever, dysuria or weight loss  Past Medical History: Past Medical History:  Diagnosis Date  . Perianal abscess, right posterior s/o I&D 10/18/2013 10/18/2013  . 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 gross.  Past Surgical History: Past Surgical History:  Procedure Laterality Date  . HERNIA REPAIR    . INCISION AND DRAINAGE PERIRECTAL ABSCESS  10/17/2013     Family History: Family History  Problem Relation Age of Onset  . Hyperlipidemia Father   . Hypertension Maternal Grandmother   . Hyperlipidemia Maternal Grandfather   . Colon cancer Neg Hx   . Stomach cancer Neg Hx   . Rectal cancer Neg Hx   . Esophageal cancer Neg Hx   . Pancreatic cancer Neg Hx     Social History: Social History   Socioeconomic History  . Marital status: Single    Spouse name: Not on file  . Number of children: Not on file  . Years of education: Not on file  . Highest education level: Not on file  Occupational History  . Not on file  Social Needs  . Financial resource strain: Not on file  . Food insecurity:    Worry: Not on file    Inability: Not on file  . Transportation needs:    Medical: Not on file    Non-medical: Not on file  Tobacco Use  . Smoking status: Never Smoker  . Smokeless tobacco: Never Used  Substance and Sexual Activity  . Alcohol use: Yes    Comment: occasional  . Drug use: No  . Sexual activity: Never  Lifestyle  . Physical activity:    Days per week: Not on file    Minutes per session: Not on file  . Stress: Not on file  Relationships  . Social connections:    Talks on phone: Not on file  Gets together: Not on file    Attends religious service: Not on file    Active member of club or organization: Not on file    Attends meetings of clubs or organizations: Not on file    Relationship status: Not on file  Other Topics Concern  . Not on file  Social History Narrative  . Not on file    Allergies: No Known Allergies  Outpatient Meds: Current Outpatient Medications  Medication Sig Dispense Refill  . omeprazole (PRILOSEC) 40 MG capsule Take 1 capsule (40 mg total) by mouth daily. 30 capsule 1   No current facility-administered medications for  this visit.       ___________________________________________________________________ Objective   No exam-virtual visit. He is well-appearing with normal vocal quality.  Appears well-nourished.   Assessment: Encounter Diagnoses  Name Primary?  . Gastroesophageal reflux disease, esophagitis presence not specified Yes  . Dyspepsia     Dyspeptic symptoms for 8 to 10 weeks, coinciding with increased stressors from code related restrictions.  No red flag symptoms to suggest need for endoscopy at this point. No current fever or cough, and duration of current symptoms speaks against active COVID infection.  It sounds like he has suffered from variable degrees of anxiety for years, and does not have regular primary care.  He has occasionally gone to urgent care for some health needs, but is not established with anyone regularly.  I believe that would be helpful to him.  Plan:  Omeprazole 40 mg once daily prescribed.  30 tablets, 1 refill.  My office will give him contact information for [facility] practices near his home in [place].  Follow-up with me as needed.  He was strongly encouraged to contact me if he develops dysphagia and/or weight loss.  Thank you for the courtesy of this consult.  Please call me with any questions or concerns.  Charlie Pitter III  CC: Referring provider noted above

## 2019-04-03 ENCOUNTER — Ambulatory Visit: Payer: Self-pay | Admitting: Family Medicine

## 2019-04-17 ENCOUNTER — Encounter: Payer: Self-pay | Admitting: Family Medicine

## 2019-04-17 ENCOUNTER — Other Ambulatory Visit: Payer: Self-pay

## 2019-04-17 ENCOUNTER — Ambulatory Visit (INDEPENDENT_AMBULATORY_CARE_PROVIDER_SITE_OTHER): Payer: BLUE CROSS/BLUE SHIELD | Admitting: Family Medicine

## 2019-04-17 VITALS — BP 122/76 | HR 70 | Temp 98.4°F | Ht 75.0 in | Wt 197.0 lb

## 2019-04-17 DIAGNOSIS — F419 Anxiety disorder, unspecified: Secondary | ICD-10-CM

## 2019-04-17 DIAGNOSIS — K219 Gastro-esophageal reflux disease without esophagitis: Secondary | ICD-10-CM | POA: Diagnosis not present

## 2019-04-17 DIAGNOSIS — F329 Major depressive disorder, single episode, unspecified: Secondary | ICD-10-CM | POA: Diagnosis not present

## 2019-04-17 DIAGNOSIS — Z7689 Persons encountering health services in other specified circumstances: Secondary | ICD-10-CM

## 2019-04-17 LAB — T4, FREE: Free T4: 0.87 ng/dL (ref 0.60–1.60)

## 2019-04-17 LAB — VITAMIN D 25 HYDROXY (VIT D DEFICIENCY, FRACTURES): VITD: 9.59 ng/mL — ABNORMAL LOW (ref 30.00–100.00)

## 2019-04-17 LAB — TSH: TSH: 0.74 u[IU]/mL (ref 0.35–4.50)

## 2019-04-17 NOTE — Progress Notes (Signed)
Patient presents to clinic today to f/u on ongoing concern and establish care.  SUBJECTIVE: PMH: Pt is a 28 yo male with pmh sig for ADHD, anxiety, GERD.  Pt previously seen at [facility].  Acute concern: -pt notes dealing with anxiety x several years -Symptoms initially noted in 2013 while at [facility].  Pt states was on academic probation.  Thinks he may have had a panic attack after taking a test -states was told he had ADHD, started on adderall. -was taken off adderall 2/2 studying abroad in [place], as amphetamines are illegal. -At some point patient was put on an antidepressant. -since then pt notes decreased motivation, increased sleep, increased stress and episodes of CP. -pt also with tightness in shoulders, neck and jaw. -pt unsure if he is clenching his teeth, but does note will twist his hair when anxious. -denies Si/HI  GERD: -Taking omeprazole 40 mg daily -symptoms controlled  Allergies: NKDA  Past surgical history: Superficial fistulotomy 2/2 perirectal fistula in 2015  Social hx: Pt is single.  He is currently living at home with his mother who works for [employer].  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  Pt denies EtOH, tobacco, and drug use.  Was using CBD for anxiety.  Family medical history: Mom-AAW   Past Medical History:  Diagnosis Date  . Perianal abscess, right posterior s/o I&D 10/18/2013 10/18/2013  . 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 HERNIA REPAIR    . INCISION AND DRAINAGE PERIRECTAL ABSCESS  10/17/2013    Current Outpatient Medications on File Prior to Visit  Medication Sig Dispense Refill  . omeprazole (PRILOSEC) 40 MG capsule Take 1 capsule (40 mg total) by mouth daily. 30 capsule 1   No current facility-administered medications on file prior to visit.     No Known Allergies  Family History  Problem  Relation Age of Onset  . Hyperlipidemia Father   . Hypertension Maternal Grandmother   . Hyperlipidemia Maternal Grandfather   . Colon cancer Neg Hx   . Stomach cancer Neg Hx   . Rectal cancer Neg Hx   . Esophageal cancer Neg Hx   . Pancreatic cancer Neg Hx     Social History   Socioeconomic History  . Marital status: Single    Spouse name: Not on file  . Number of children: Not on file  . Years of education: Not on file  . Highest education level: Not on file  Occupational History  . Not on file  Social Needs  . Financial resource strain: Not on file  . Food insecurity    Worry: Not on file    Inability: Not on file  . Transportation needs    Medical: Not on file    Non-medical: Not on file  Tobacco Use  . Smoking status: Never Smoker  . Smokeless tobacco: Never Used  Substance and Sexual Activity  . Alcohol use: Yes    Comment: occasional  . Drug use: No  . Sexual activity: Never  Lifestyle  . Physical activity    Days per week: Not on file    Minutes per session: Not on file  . Stress: Not on file  Relationships  . Social Herbalist on phone: Not on file    Gets together: Not on file    Attends religious service: Not on file  Active member of club or organization: Not on file    Attends meetings of clubs or organizations: Not on file    Relationship status: Not on file  . Intimate partner violence    Fear of current or ex partner: Not on file    Emotionally abused: Not on file    Physically abused: Not on file    Forced sexual activity: Not on file  Other Topics Concern  . Not on file  Social History Narrative  . Not on file    ROS General: Denies fever, chills, night sweats, changes in weight, changes in appetite  +increased stress HEENT: Denies headaches, ear pain, changes in vision, rhinorrhea, sore throat CV: Denies CP, palpitations, SOB, orthopnea Pulm: Denies SOB, cough, wheezing GI: Denies abdominal pain, nausea, vomiting,  diarrhea, constipation GU: Denies dysuria, hematuria, frequency, vaginal discharge Msk: Denies muscle cramps, joint pains Neuro: Denies weakness, numbness, tingling Skin: Denies rashes, bruising Psych: Denies depression, hallucinations  +anxiety   BP 122/76 (BP Location: Right Arm, Patient Position: Sitting, Cuff Size: Normal)   Pulse 70   Temp 98.4 F (36.9 C) (Oral)   Ht 6\' 3"  (1.905 m)   Wt 197 lb (89.4 kg)   SpO2 97%   BMI 24.62 kg/m   Physical Exam Gen. Pleasant, well developed, well-nourished, in NAD HEENT - Nelson/AT, PERRL, no scleral icterus, no nasal drainage, pharynx without erythema or exudate. Neck: No thyromegaly Lungs: no use of accessory muscles, CTAB, no wheezes, rales or rhonchi Cardiovascular: RRR,  No r/g/m, no peripheral edema Musculoskeletal: No deformities, moves all four extremities, no cyanosis or clubbing, normal tone Neuro:  A&Ox3, CN II-XII intact, normal gait Skin:  Warm, dry, intact.  Acne and hyperpigmentation of skin on back Psych: normal affect, mood appropriate  No results found for this or any previous visit (from the past 2160 hour(s)).  Assessment/Plan: Anxiety and depression  -PHQ 9 score 16 -GAD 7 score 8 -discussed counseling.  Given handouts on area providers.  Pt encouraged to schedule appointment -Will try counseling first then reassess and add medication if needed. -discussed ways to reduce stress as likely contributing to muscle tension. -will check thyroid function - Plan: TSH, T4, Free, Vitamin D, 25-hydroxy  Gastroesophageal reflux disease, esophagitis presence not specified  - continue prilosec 40 mg daily -discussed avoiding foods known to cause symptoms.  Given handout  Encounter to establish care  -We reviewed the PMH, PSH, FH, SH, Meds and Allergies. -We provided refills for any medications we will prescribe as needed. -We addressed current concerns per orders and patient instructions. -We have asked for records for  pertinent exams, studies, vaccines and notes from previous providers. -We have advised patient to follow up per instructions below.  F/u in 6 wks, sooner if needed.  Abbe AmsterdamShannon Kaydee Magel, MD

## 2019-04-17 NOTE — Patient Instructions (Signed)
Living With Depression Everyone experiences occasional disappointment, sadness, and loss in their lives. When you are feeling down, blue, or sad for at least 2 weeks in a row, it may mean that you have depression. Depression can affect your thoughts and feelings, relationships, daily activities, and physical health. It is caused by changes in the way your brain functions. If you receive a diagnosis of depression, your health care provider will tell you which type of depression you have and what treatment options are available to you. If you are living with depression, there are ways to help you recover from it and also ways to prevent it from coming back. How to cope with lifestyle changes Coping with stress     Stress is your body's reaction to life changes and events, both good and bad. Stressful situations may include:  Getting married.  The death of a spouse.  Losing a job.  Retiring.  Having a baby. Stress can last just a few hours or it can be ongoing. Stress can play a major role in depression, so it is important to learn both how to cope with stress and how to think about it differently. Talk with your health care provider or a counselor if you would like to learn more about stress reduction. He or she may suggest some stress reduction techniques, such as:  Music therapy. This can include creating music or listening to music. Choose music that you enjoy and that inspires you.  Mindfulness-based meditation. This kind of meditation can be done while sitting or walking. It involves being aware of your normal breaths, rather than trying to control your breathing.  Centering prayer. This is a kind of meditation that involves focusing on a spiritual word or phrase. Choose a word, phrase, or sacred image that is meaningful to you and that brings you peace.  Deep breathing. To do this, expand your stomach and inhale slowly through your nose. Hold your breath for 3-5 seconds, then exhale  slowly, allowing your stomach muscles to relax.  Muscle relaxation. This involves intentionally tensing muscles then relaxing them. Choose a stress reduction technique that fits your lifestyle and personality. Stress reduction techniques take time and practice to develop. Set aside 5-15 minutes a day to do them. Therapists can offer training in these techniques. The training may be covered by some insurance plans. Other things you can do to manage stress include:  Keeping a stress diary. This can help you learn what triggers your stress and ways to control your response.  Understanding what your limits are and saying no to requests or events that lead to a schedule that is too full.  Thinking about how you respond to certain situations. You may not be able to control everything, but you can control how you react.  Adding humor to your life by watching funny films or TV shows.  Making time for activities that help you relax and not feeling guilty about spending your time this way.  Medicines Your health care provider may suggest certain medicines if he or she feels that they will help improve your condition. Avoid using alcohol and other substances that may prevent your medicines from working properly (may interact). It is also important to:  Talk with your pharmacist or health care provider about all the medicines that you take, their possible side effects, and what medicines are safe to take together.  Make it your goal to take part in all treatment decisions (shared decision-making). This includes giving input on   the side effects of medicines. It is best if shared decision-making with your health care provider is part of your total treatment plan. If your health care provider prescribes a medicine, you may not notice the full benefits of it for 4-8 weeks. Most people who are treated for depression need to be on medicine for at least 6-12 months after they feel better. If you are taking  medicines as part of your treatment, do not stop taking medicines without first talking to your health care provider. You may need to have the medicine slowly decreased (tapered) over time to decrease the risk of harmful side effects. Relationships Your health care provider may suggest family therapy along with individual therapy and drug therapy. While there may not be family problems that are causing you to feel depressed, it is still important to make sure your family learns as much as they can about your mental health. Having your family's support can help make your treatment successful. How to recognize changes in your condition Everyone has a different response to treatment for depression. Recovery from major depression happens when you have not had signs of major depression for two months. This may mean that you will start to:  Have more interest in doing activities.  Feel less hopeless than you did 2 months ago.  Have more energy.  Overeat less often, or have better or improving appetite.  Have better concentration. Your health care provider will work with you to decide the next steps in your recovery. It is also important to recognize when your condition is getting worse. Watch for these signs:  Having fatigue or low energy.  Eating too much or too little.  Sleeping too much or too little.  Feeling restless, agitated, or hopeless.  Having trouble concentrating or making decisions.  Having unexplained physical complaints.  Feeling irritable, angry, or aggressive. Get help as soon as you or your family members notice these symptoms coming back. How to get support and help from others How to talk with friends and family members about your condition  Talking to friends and family members about your condition can provide you with one way to get support and guidance. Reach out to trusted friends or family members, explain your symptoms to them, and let them know that you are  working with a health care provider to treat your depression. Financial resources Not all insurance plans cover mental health care, so it is important to check with your insurance carrier. If paying for co-pays or counseling services is a problem, search for a local or county mental health care center. They may be able to offer public mental health care services at low or no cost when you are not able to see a private health care provider. If you are taking medicine for depression, you may be able to get the generic form, which may be less expensive. Some makers of prescription medicines also offer help to patients who cannot afford the medicines they need. Follow these instructions at home:   Get the right amount and quality of sleep.  Cut down on using caffeine, tobacco, alcohol, and other potentially harmful substances.  Try to exercise, such as walking or lifting small weights.  Take over-the-counter and prescription medicines only as told by your health care provider.  Eat a healthy diet that includes plenty of vegetables, fruits, whole grains, low-fat dairy products, and lean protein. Do not eat a lot of foods that are high in solid fats, added sugars, or salt.    Keep all follow-up visits as told by your health care provider. This is important. Contact a health care provider if:  You stop taking your antidepressant medicines, and you have any of these symptoms: ? Nausea. ? Headache. ? Feeling lightheaded. ? Chills and body aches. ? Not being able to sleep (insomnia).  You or your friends and family think your depression is getting worse. Get help right away if:  You have thoughts of hurting yourself or others. If you ever feel like you may hurt yourself or others, or have thoughts about taking your own life, get help right away. You can go to your nearest emergency department or call:  Your local emergency services (911 in the [place]  A suicide crisis helpline, such as the  National Suicide Prevention Lifeline at (619)481-44761-403-030-5126. This is open 24-hours a day. Summary  If you are living with depression, there are ways to help you recover from it and also ways to prevent it from coming back.  Work with your health care team to create a management plan that includes counseling, stress management techniques, and healthy lifestyle habits. This information is not intended to replace advice given to you by your health care provider. Make sure you discuss any questions you have with your health care provider. Document Released: 08/22/2016 Document Revised: 01/11/2019 Document Reviewed: 08/22/2016 Elsevier Patient Education  2020 [employer]  Living With Anxiety  After being diagnosed with an anxiety disorder, you may be relieved to know why you have felt or behaved a certain way. It is natural to also feel overwhelmed about the treatment ahead and what it will mean for your life. With care and support, you can manage this condition and recover from it. How to cope with anxiety Dealing with stress Stress is your body's reaction to life changes and events, both good and bad. Stress can last just a few hours or it can be ongoing. Stress can play a major role in anxiety, so it is important to learn both how to cope with stress and how to think about it differently. Talk with your health care provider or a counselor to learn more about stress reduction. He or she may suggest some stress reduction techniques, such as:  Music therapy. This can include creating or listening to music that you enjoy and that inspires you.  Mindfulness-based meditation. This involves being aware of your normal breaths, rather than trying to control your breathing. It can be done while sitting or walking.  Centering prayer. This is a kind of meditation that involves focusing on a word, phrase, or sacred image that is meaningful to you and that brings you peace.  Deep breathing. To do this, expand  your stomach and inhale slowly through your nose. Hold your breath for 3-5 seconds. Then exhale slowly, allowing your stomach muscles to relax.  Self-talk. This is a skill where you identify thought patterns that lead to anxiety reactions and correct those thoughts.  Muscle relaxation. This involves tensing muscles then relaxing them. Choose a stress reduction technique that fits your lifestyle and personality. Stress reduction techniques take time and practice. Set aside 5-15 minutes a day to do them. Therapists can offer training in these techniques. The training may be covered by some insurance plans. Other things you can do to manage stress include:  Keeping a stress diary. This can help you learn what triggers your stress and ways to control your response.  Thinking about how you respond to certain situations. You may not  be able to control everything, but you can control your reaction.  Making time for activities that help you relax, and not feeling guilty about spending your time in this way. Therapy combined with coping and stress-reduction skills provides the best chance for successful treatment. Medicines Medicines can help ease symptoms. Medicines for anxiety include:  Anti-anxiety drugs.  Antidepressants.  Beta-blockers. Medicines may be used as the main treatment for anxiety disorder, along with therapy, or if other treatments are not working. Medicines should be prescribed by a health care provider. Relationships Relationships can play a big part in helping you recover. Try to spend more time connecting with trusted friends and family members. Consider going to couples counseling, taking family education classes, or going to family therapy. Therapy can help you and others better understand the condition. How to recognize changes in your condition Everyone has a different response to treatment for anxiety. Recovery from anxiety happens when symptoms decrease and stop interfering  with your daily activities at home or work. This may mean that you will start to:  Have better concentration and focus.  Sleep better.  Be less irritable.  Have more energy.  Have improved memory. It is important to recognize when your condition is getting worse. Contact your health care provider if your symptoms interfere with home or work and you do not feel like your condition is improving. Where to find help and support: You can get help and support from these sources:  Self-help groups.  Online and [employer].  A trusted spiritual leader.  Couples counseling.  Family education classes.  Family therapy. Follow these instructions at home:  Eat a healthy diet that includes plenty of vegetables, fruits, whole grains, low-fat dairy products, and lean protein. Do not eat a lot of foods that are high in solid fats, added sugars, or salt.  Exercise. Most adults should do the following: ? Exercise for at least 150 minutes each week. The exercise should increase your heart rate and make you sweat (moderate-intensity exercise). ? Strengthening exercises at least twice a week.  Cut down on caffeine, tobacco, alcohol, and other potentially harmful substances.  Get the right amount and quality of sleep. Most adults need 7-9 hours of sleep each night.  Make choices that simplify your life.  Take over-the-counter and prescription medicines only as told by your health care provider.  Avoid caffeine, alcohol, and certain over-the-counter cold medicines. These may make you feel worse. Ask your pharmacist which medicines to avoid.  Keep all follow-up visits as told by your health care provider. This is important. Questions to ask your health care provider  Would I benefit from therapy?  How often should I follow up with a health care provider?  How long do I need to take medicine?  Are there any long-term side effects of my medicine?  Are there any alternatives to  taking medicine? Contact a health care provider if:  You have a hard time staying focused or finishing daily tasks.  You spend many hours a day feeling worried about everyday life.  You become exhausted by worry.  You start to have headaches, feel tense, or have nausea.  You urinate more than normal.  You have diarrhea. Get help right away if:  You have a racing heart and shortness of breath.  You have thoughts of hurting yourself or others. If you ever feel like you may hurt yourself or others, or have thoughts about taking your own life, get help right away. You  can go to your nearest emergency department or call:  Your local emergency services (911 in the [place]  A suicide crisis helpline, such as the National Suicide Prevention Lifeline at 651-812-94751-(412) 039-6754. This is open 24-hours a day. Summary  Taking steps to deal with stress can help calm you.  Medicines cannot cure anxiety disorders, but they can help ease symptoms.  Family, friends, and partners can play a big part in helping you recover from an anxiety disorder. This information is not intended to replace advice given to you by your health care provider. Make sure you discuss any questions you have with your health care provider. Document Released: 09/13/2016 Document Revised: 09/01/2017 Document Reviewed: 09/13/2016 Elsevier Patient Education  2020 [employer]  Food Choices for Gastroesophageal Reflux Disease, Adult When you have gastroesophageal reflux disease (GERD), the foods you eat and your eating habits are very important. Choosing the right foods can help ease your discomfort. Think about working with a nutrition specialist (dietitian) to help you make good choices. What are tips for following this plan?  Meals  Choose healthy foods that are low in fat, such as fruits, vegetables, whole grains, low-fat dairy products, and lean meat, fish, and poultry.  Eat small meals often instead of 3 large meals a day.  Eat your meals slowly, and in a place where you are relaxed. Avoid bending over or lying down until 2-3 hours after eating.  Avoid eating meals 2-3 hours before bed.  Avoid drinking a lot of liquid with meals.  Cook foods using methods other than frying. Bake, grill, or broil food instead.  Avoid or limit: ? Chocolate. ? Peppermint or spearmint. ? Alcohol. ? Pepper. ? Black and decaffeinated coffee. ? Black and decaffeinated tea. ? Bubbly (carbonated) soft drinks. ? Caffeinated energy drinks and soft drinks.  Limit high-fat foods such as: ? Fatty meat or fried foods. ? Whole milk, cream, butter, or ice cream. ? Nuts and nut butters. ? Pastries, donuts, and sweets made with butter or shortening.  Avoid foods that cause symptoms. These foods may be different for everyone. Common foods that cause symptoms include: ? Tomatoes. ? Oranges, lemons, and limes. ? Peppers. ? Spicy food. ? Onions and garlic. ? Vinegar. Lifestyle  Maintain a healthy weight. Ask your doctor what weight is healthy for you. If you need to lose weight, work with your doctor to do so safely.  Exercise for at least 30 minutes for 5 or more days each week, or as told by your doctor.  Wear loose-fitting clothes.  Do not smoke. If you need help quitting, ask your doctor.  Sleep with the head of your bed higher than your feet. Use a wedge under the mattress or blocks under the bed frame to raise the head of the bed. Summary  When you have gastroesophageal reflux disease (GERD), food and lifestyle choices are very important in easing your symptoms.  Eat small meals often instead of 3 large meals a day. Eat your meals slowly, and in a place where you are relaxed.  Limit high-fat foods such as fatty meat or fried foods.  Avoid bending over or lying down until 2-3 hours after eating.  Avoid peppermint and spearmint, caffeine, alcohol, and chocolate. This information is not intended to replace advice given  to you by your health care provider. Make sure you discuss any questions you have with your health care provider. Document Released: 03/20/2012 Document Revised: 01/10/2019 Document Reviewed: 10/25/2016 Elsevier Patient Education  2020 [employer]

## 2019-04-18 ENCOUNTER — Other Ambulatory Visit: Payer: Self-pay | Admitting: Family Medicine

## 2019-04-18 DIAGNOSIS — E559 Vitamin D deficiency, unspecified: Secondary | ICD-10-CM

## 2019-04-18 MED ORDER — VITAMIN D (ERGOCALCIFEROL) 1.25 MG (50000 UNIT) PO CAPS: 50000.0000 [IU] | ORAL_CAPSULE | ORAL | 0 refills | Status: DC

## 2019-05-02 ENCOUNTER — Ambulatory Visit (INDEPENDENT_AMBULATORY_CARE_PROVIDER_SITE_OTHER): Payer: BLUE CROSS/BLUE SHIELD | Admitting: Psychology

## 2019-05-02 DIAGNOSIS — F4323 Adjustment disorder with mixed anxiety and depressed mood: Secondary | ICD-10-CM

## 2019-05-07 DIAGNOSIS — L700 Acne vulgaris: Secondary | ICD-10-CM | POA: Diagnosis not present

## 2019-05-16 ENCOUNTER — Ambulatory Visit (INDEPENDENT_AMBULATORY_CARE_PROVIDER_SITE_OTHER): Payer: BLUE CROSS/BLUE SHIELD | Admitting: Psychology

## 2019-05-16 DIAGNOSIS — F4322 Adjustment disorder with anxiety: Secondary | ICD-10-CM

## 2019-06-13 ENCOUNTER — Encounter: Payer: Self-pay | Admitting: Family Medicine

## 2019-06-13 ENCOUNTER — Ambulatory Visit (INDEPENDENT_AMBULATORY_CARE_PROVIDER_SITE_OTHER): Payer: BLUE CROSS/BLUE SHIELD | Admitting: Psychology

## 2019-06-13 ENCOUNTER — Ambulatory Visit (INDEPENDENT_AMBULATORY_CARE_PROVIDER_SITE_OTHER): Payer: BLUE CROSS/BLUE SHIELD | Admitting: Family Medicine

## 2019-06-13 ENCOUNTER — Other Ambulatory Visit: Payer: Self-pay

## 2019-06-13 VITALS — BP 118/70 | HR 59 | Temp 97.6°F | Wt 192.2 lb

## 2019-06-13 DIAGNOSIS — F4322 Adjustment disorder with anxiety: Secondary | ICD-10-CM | POA: Diagnosis not present

## 2019-06-13 DIAGNOSIS — F419 Anxiety disorder, unspecified: Secondary | ICD-10-CM | POA: Diagnosis not present

## 2019-06-13 DIAGNOSIS — F329 Major depressive disorder, single episode, unspecified: Secondary | ICD-10-CM | POA: Diagnosis not present

## 2019-06-13 DIAGNOSIS — K219 Gastro-esophageal reflux disease without esophagitis: Secondary | ICD-10-CM

## 2019-06-13 DIAGNOSIS — S61212S Laceration without foreign body of right middle finger without damage to nail, sequela: Secondary | ICD-10-CM | POA: Diagnosis not present

## 2019-06-13 MED ORDER — PAROXETINE HCL 20 MG PO TABS: 20.0000 mg | ORAL_TABLET | Freq: Every day | ORAL | 3 refills | Status: DC

## 2019-06-13 MED ORDER — OMEPRAZOLE 40 MG PO CPDR: 40.0000 mg | DELAYED_RELEASE_CAPSULE | Freq: Every day | ORAL | 3 refills | Status: DC

## 2019-06-13 NOTE — Progress Notes (Signed)
Subjective:    Patient ID: Billy Garcia, male    DOB: Jun 20, 1991, 28 y.o.   MRN: [id number]  No chief complaint on file.   HPI Patient was seen today for f/u.    GERD: -taking prilosec 40 mg, still having issues -also tried prilosec, pepto, tums with some relief -cut out dairy -endorses stomach pain, bloating with eating most foods  Anxiety and depression: -started seeing a counselor monthly -pt thinks bi-monthly appointments would be more beneficial, but are expensive -interested in starting meds  -thinks decreased concentration might be helped if fixes his depression.  Finger concern: -pt punched a box at work -endorses laceration on R middle finger, PIP joint -area has since healed but has a bump on the dorsum of the joint -able to make a fist -the area was initially tender, but pain is now a 1.5/10  Past Medical History:  Diagnosis Date  . Perianal abscess, right posterior s/o I&D 10/18/2013 10/18/2013  . 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 12/04/2013    No Known Allergies  ROS General: Denies fever, chills, night sweats, changes in weight, changes in appetite HEENT: Denies headaches, ear pain, changes in vision, rhinorrhea, sore throat CV: Denies CP, palpitations, SOB, orthopnea Pulm: Denies SOB, cough, wheezing GI: Denies nausea, vomiting, diarrhea, constipation  +abdominal pain, bloating GU: Denies dysuria, hematuria, frequency, vaginal discharge Msk: Denies muscle cramps, joint pains  +finger pain and bump Neuro: Denies weakness, numbness, tingling Skin: Denies rashes, bruising Psych: Denies hallucinations  +anxiety and depression    Objective:    Blood pressure 118/70, pulse (!) 59, temperature 97.6 F (36.4 C), temperature source Temporal, weight 192 lb 3.2 oz (87.2 kg), SpO2 97 %.   Gen. Pleasant, well-nourished, in no distress, normal affect   HEENT: [place]/AT, face symmetric, no scleral icterus, PERRLA, EOMI,  nares patent without drainage Lungs: no accessory muscle use, CTAB, no wheezes or rales Cardiovascular: RRR, no m/r/g, no peripheral edema Abdomen: BS present, soft, NT/ND, no hepatosplenomegaly. Musculoskeletal: No deformities, no cyanosis or clubbing, normal tone Neuro:  A&Ox3, CN II-XII intact, normal gait Skin:  Warm, no lesions/ rash      Wt Readings from Last 3 Encounters:  06/13/19 192 lb 3.2 oz (87.2 kg)  04/17/19 197 lb (89.4 kg)  03/05/19 200 lb (90.7 kg)    Lab Results  Component Value Date   TSH 0.74 04/17/2019    Assessment/Plan:  Anxiety and depression -PHQ 9 score 14  Was 16 on 7/15 -GAD 7 score 9  Was 8 on 7/15 -TSH and free T 4  Low normal on 04/17/19 -continue counseling - Plan: PARoxetine (PAXIL) 20 MG tablet -f/uin 4-6 wks, sooner if needed  Gastroesophageal reflux disease, esophagitis presence not specified  -continue omeprazole 40 mg daily -avoid foods known to cause problems - Plan: Ambulatory referral to Gastroenterology  Laceration of right middle finger without foreign body without damage to nail, sequela -healed -will continue to monitor.  If raised area fails to resolve consider referral to ortho or hand specialist.  F/u in 4-6 wks  Grier Mitts, MD

## 2019-06-21 ENCOUNTER — Ambulatory Visit (INDEPENDENT_AMBULATORY_CARE_PROVIDER_SITE_OTHER): Payer: BLUE CROSS/BLUE SHIELD | Admitting: Physician Assistant

## 2019-06-21 ENCOUNTER — Other Ambulatory Visit: Payer: Self-pay

## 2019-06-21 ENCOUNTER — Encounter: Payer: Self-pay | Admitting: Physician Assistant

## 2019-06-21 DIAGNOSIS — K219 Gastro-esophageal reflux disease without esophagitis: Secondary | ICD-10-CM | POA: Diagnosis not present

## 2019-06-21 NOTE — Progress Notes (Signed)
Chief Complaint: "Abdominal acid"  HPI:    Billy Garcia is a 28 year old male with a past medical history as listed below, known to Dr. Loletha Carrow for his reflux, who presents to clinic today for "abdominal acid".     03/05/2019 office visit with Dr. Loletha Carrow.  At that time, was self-referred with 2 to 3 months of epigastric pain and reflux.  Noted this had started since the onset of COVID related quarantine with epigastric burning discomfort and some regurgitation.  Also noted episodes of anxiety and panic attacks in the past.  Prescribed Prilosec 40 mg daily.    Today, the patient tells me as long as he is using his Omeprazole 40 mg daily he has no abdominal pain or reflux symptoms but "I cannot keep just using a pill to treat myself all the time".  He would like to know what is ultimately going on.  Associated symptoms include a weight loss of 12 to 15 pounds over the past 6 months.    Continues to tell me that he has developed lactose intolerance and is trying to avoid milk and dairy products.  If he does this he can avoid abdominal pain and diarrhea.    Patient does admit to some depression since the beginning of COVID in quarantine.  He was recently been started on Paxil by his PCP.    Denies fever, chills or symptoms that awaken him from sleep.  Past Medical History:  Diagnosis Date  . Perianal abscess, right posterior s/o I&D 10/18/2013 10/18/2013  . 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 HERNIA REPAIR    . INCISION AND DRAINAGE PERIRECTAL ABSCESS  10/17/2013    Current Outpatient Medications  Medication Sig Dispense Refill  . omeprazole (PRILOSEC) 40 MG capsule Take 1 capsule (40 mg total) by mouth daily. 30 capsule 3  . PARoxetine (PAXIL) 20 MG tablet Take 1 tablet (20 mg total) by mouth daily. 30 tablet 3  . Vitamin D, Ergocalciferol, (DRISDOL) 1.25 MG (50000 UT) CAPS capsule Take 1 capsule  (50,000 Units total) by mouth every 7 (seven) days. 12 capsule 0   No current facility-administered medications for this visit.     Allergies as of 06/21/2019  . (No Known Allergies)    Family History  Problem Relation Age of Onset  . Hyperlipidemia Father   . Hypertension Maternal Grandmother   . Hyperlipidemia Maternal Grandfather   . Colon cancer Neg Hx   . Stomach cancer Neg Hx   . Rectal cancer Neg Hx   . Esophageal cancer Neg Hx   . Pancreatic cancer Neg Hx     Social History   Socioeconomic History  . Marital status: Single    Spouse name: Not on file  . Number of children: Not on file  . Years of education: Not on file  . Highest education level: Not on file  Occupational History  . Not on file  Social Needs  . Financial resource strain: Not on file  . Food insecurity    Worry: Not on file    Inability: Not on file  . Transportation needs    Medical: Not on file    Non-medical: Not on file  Tobacco Use  . Smoking status: Never Smoker  . Smokeless tobacco: Never Used  Substance and Sexual Activity  . Alcohol use: Yes    Comment: occasional  . Drug use: No  . Sexual  activity: Never  Lifestyle  . Physical activity    Days per week: Not on file    Minutes per session: Not on file  . Stress: Not on file  Relationships  . Social Musicianconnections    Talks on phone: Not on file    Gets together: Not on file    Attends religious service: Not on file    Active member of club or organization: Not on file    Attends meetings of clubs or organizations: Not on file    Relationship status: Not on file  . Intimate partner violence    Fear of current or ex partner: Not on file    Emotionally abused: Not on file    Physically abused: Not on file    Forced sexual activity: Not on file  Other Topics Concern  . Not on file  Social History Narrative  . Not on file    Review of Systems:    Constitutional: No weight loss, fever or chills Cardiovascular: No chest  pain  Respiratory: No SOB Gastrointestinal: See HPI and otherwise negative   Physical Exam:  Vital signs: BP 110/78 (BP Location: Left Arm, Patient Position: Sitting, Cuff Size: Normal)   Pulse 74   Temp 98.8 F (37.1 C) (Oral)   Ht 6\' 4"  (1.93 m)   Wt 188 lb 4 oz (85.4 kg)   BMI 22.91 kg/m   Constitutional:   Pleasant Caucasian male appears to be in NAD, Well developed, Well nourished, alert and cooperative Respiratory: Respirations even and unlabored. Lungs clear to auscultation bilaterally.   No wheezes, crackles, or rhonchi.  Cardiovascular: Normal S1, S2. No MRG. Regular rate and rhythm. No peripheral edema, cyanosis or pallor.  Gastrointestinal:  Soft, nondistended, mild epigastric ttp. No rebound or guarding. Normal bowel sounds. No appreciable masses or hepatomegaly. Rectal:  Not performed.  Psychiatric: Demonstrates good judgement and reason without abnormal affect or behaviors.  No recent labs.  Assessment: 1. GERD: Controlled on Omeprazole 40 mg daily 2. Epigastric pain: With above  Plan: 1.  Continue Omeprazole 40 mg daily, 30-60 minutes before breakfast. 2.  Patient wished to be scheduled for EGD for further evaluation of epigastric pain.  [facility]  Did discuss risks, benefits, limitations and alternatives and patient agrees to proceed. 3.  Briefly discussed depression.  He is following with someone in regards to this. 4.  Patient to follow in clinic per recommendations with Dr. Myrtie Neitheranis after time of procedure.  Billy Garcia , PA-C [facility] 06/21/2019, 2:41 PM  Cc: Deeann SaintBanks, Shannon R, MD

## 2019-06-21 NOTE — Progress Notes (Signed)
____________________________________________________________  Attending physician addendum:  Thank you for sending this case to me. I have reviewed the entire note, and the outlined plan seems appropriate.  Henry Danis, MD  ____________________________________________________________  

## 2019-06-21 NOTE — Patient Instructions (Signed)
If you are age 27 or older, your body mass index should be between 23-30. Your Body mass index is 22.91 kg/m. If this is out of the aforementioned range listed, please consider follow up with your Primary Care Provider.  If you are age 60 or younger, your body mass index should be between 19-25. Your Body mass index is 22.91 kg/m. If this is out of the aformentioned range listed, please consider follow up with your Primary Care Provider.   You have been scheduled for an endoscopy. Please follow written instructions given to you at your visit today. If you use inhalers (even only as needed), please bring them with you on the day of your procedure.  Continue Omeprazole 40mg  - once daily.   Thank you for choosing me and [facility].  Dennison Bulla

## 2019-07-04 ENCOUNTER — Encounter: Payer: Self-pay | Admitting: Gastroenterology

## 2019-07-08 ENCOUNTER — Encounter: Payer: BLUE CROSS/BLUE SHIELD | Admitting: Gastroenterology

## 2019-07-11 ENCOUNTER — Ambulatory Visit: Payer: BLUE CROSS/BLUE SHIELD | Admitting: Psychology

## 2019-07-12 ENCOUNTER — Telehealth: Payer: Self-pay | Admitting: Gastroenterology

## 2019-07-12 NOTE — Telephone Encounter (Signed)
Do you now or have you had a fever in the last 14 days?     No °  °Do you have any respiratory symptoms of shortness of breath or cough now or in the last 14 days?    No °  °Do you have any family members or close contacts with diagnosed or suspected Covid-19 in the past 14 days?  No °  °Have you been tested for Covid-19 and found to be positive?    No ° °

## 2019-07-15 ENCOUNTER — Ambulatory Visit (AMBULATORY_SURGERY_CENTER): Payer: BLUE CROSS/BLUE SHIELD | Admitting: Gastroenterology

## 2019-07-15 ENCOUNTER — Other Ambulatory Visit: Payer: Self-pay

## 2019-07-15 ENCOUNTER — Other Ambulatory Visit: Payer: Self-pay | Admitting: Gastroenterology

## 2019-07-15 ENCOUNTER — Encounter: Payer: Self-pay | Admitting: Gastroenterology

## 2019-07-15 VITALS — BP 113/60 | HR 48 | Temp 98.0°F | Resp 11 | Ht 76.0 in | Wt 188.0 lb

## 2019-07-15 DIAGNOSIS — R1013 Epigastric pain: Secondary | ICD-10-CM | POA: Diagnosis not present

## 2019-07-15 DIAGNOSIS — K2950 Unspecified chronic gastritis without bleeding: Secondary | ICD-10-CM

## 2019-07-15 DIAGNOSIS — K269 Duodenal ulcer, unspecified as acute or chronic, without hemorrhage or perforation: Secondary | ICD-10-CM

## 2019-07-15 MED ORDER — SODIUM CHLORIDE 0.9 % IV SOLN: 500.0000 mL | Freq: Once | INTRAVENOUS | Status: DC

## 2019-07-15 NOTE — Progress Notes (Signed)
To PACU, VSS. Report to [email or web address] 

## 2019-07-15 NOTE — Patient Instructions (Signed)
YOU HAD AN ENDOSCOPIC PROCEDURE TODAY AT [facility]:   Refer to the procedure report that was given to you for any specific questions about what was found during the examination.  If the procedure report does not answer your questions, please call your gastroenterologist to clarify.  If you requested that your care partner not be given the details of your procedure findings, then the procedure report has been included in a sealed envelope for you to review at your convenience later.  YOU SHOULD EXPECT: Some feelings of bloating in the abdomen. Passage of more gas than usual.  Walking can help get rid of the air that was put into your GI tract during the procedure and reduce the bloating. If you had a lower endoscopy (such as a colonoscopy or flexible sigmoidoscopy) you may notice spotting of blood in your stool or on the toilet paper. If you underwent a bowel prep for your procedure, you may not have a normal bowel movement for a few days.  Please Note:  You might notice some irritation and congestion in your nose or some drainage.  This is from the oxygen used during your procedure.  There is no need for concern and it should clear up in a day or so.  SYMPTOMS TO REPORT IMMEDIATELY:     Following upper endoscopy (EGD)  Vomiting of blood or coffee ground material  New chest pain or pain under the shoulder blades  Painful or persistently difficult swallowing  New shortness of breath  Fever of 100F or higher  Black, tarry-looking stools  For urgent or emergent issues, a gastroenterologist can be reached at any hour by calling [phone].   DIET:  We do recommend a small meal at first, but then you may proceed to your regular diet.  Drink plenty of fluids but you should avoid alcoholic beverages for 24 hours.  ACTIVITY:  You should plan to take it easy for the rest of today and you should NOT DRIVE or use heavy machinery until tomorrow (because of the sedation medicines  used during the test).    FOLLOW UP: Our staff will call the number listed on your records 48-72 hours following your procedure to check on you and address any questions or concerns that you may have regarding the information given to you following your procedure. If we do not reach you, we will leave a message.  We will attempt to reach you two times.  During this call, we will ask if you have developed any symptoms of COVID 19. If you develop any symptoms (ie: fever, flu-like symptoms, shortness of breath, cough etc.) before then, please call [phone].  If you test positive for Covid 19 in the 2 weeks post procedure, please call and report this information to [place].    If any biopsies were taken you will be contacted by phone or by letter within the next 1-3 weeks.  Please call us at [phone] if you have not heard about the biopsies in 3 weeks.    SIGNATURES/CONFIDENTIALITY: You and/or your care partner have signed paperwork which will be entered into your electronic medical record.  These signatures attest to the fact that that the information above on your After Visit Summary has been reviewed and is understood.  Full responsibility of the confidentiality of this discharge information lies with you and/or your care-partner.     Handout was given to you on Peptic Ulcer You may resume your current medications today. Await biopsy  results. Please call if any questions or concerns.

## 2019-07-15 NOTE — Progress Notes (Signed)
Called to room to assist during endoscopic procedure.  Patient ID and intended procedure confirmed with present staff. Received instructions for my participation in the procedure from the performing physician.  

## 2019-07-15 NOTE — Progress Notes (Addendum)
No problems noted in the recovery room. Maw Pt waiting to go over the finding of EGD with Dr. Loletha Carrow.  Pt was asleep when Dr. Loletha Carrow came in to talk with him. maw

## 2019-07-15 NOTE — Progress Notes (Signed)
LC- Temp [place]- Vitals 

## 2019-07-15 NOTE — Op Note (Signed)
[facility] Patient Name: Billy Garcia Procedure Date: 07/15/2019 10:04 AM MRN: [id number] Endoscopist: Sherilyn Cooter L. Myrtie Neither , MD Age: 28 Referring MD:  Date of Birth: 1991-08-05 Gender: Male Account #: [id number] Procedure:                Upper GI endoscopy Indications:              Epigastric abdominal pain (improved on once daily                            PPI) Medicines:                Monitored Anesthesia Care Procedure:                Pre-Anesthesia Assessment:                           - Prior to the procedure, a History and Physical                            was performed, and patient medications and                            allergies were reviewed. The patient's tolerance of                            previous anesthesia was also reviewed. The risks                            and benefits of the procedure and the sedation                            options and risks were discussed with the patient.                            All questions were answered, and informed consent                            was obtained. Prior Anticoagulants: The patient has                            taken no previous anticoagulant or antiplatelet                            agents. ASA Grade Assessment: II - A patient with                            mild systemic disease. After reviewing the risks                            and benefits, the patient was deemed in                            satisfactory condition to undergo the procedure.  After obtaining informed consent, the endoscope was                            passed under direct vision. Throughout the                            procedure, the patient's blood pressure, pulse, and                            oxygen saturations were monitored continuously. The                            Endoscope was introduced through the mouth, and                            advanced to the second part of duodenum. The upper                           GI endoscopy was accomplished without difficulty.                            The patient tolerated the procedure well. Scope In: Scope Out: Findings:                 The esophagus was normal.                           Multiple diminutive erosions were found in the                            prepyloric region of the stomach. Biopsies were                            taken with a cold forceps for histology. ([place]                            protocol).                           The exam of the stomach was otherwise normal.                           Multiple non-bleeding superficial duodenal ulcers                            with no stigmata of bleeding were found in the                            duodenal bulb and proximal sweep. Complications:            No immediate complications. Estimated Blood Loss:     Estimated blood loss was minimal. Impression:               - Normal esophagus.                           -  Erosive gastropathy. Biopsied to assess for H.                            pylori infection.                           - Multiple non-bleeding duodenal ulcers with no                            stigmata of bleeding. Recommendation:           - Patient has a contact number available for                            emergencies. The signs and symptoms of potential                            delayed complications were discussed with the                            patient. Return to normal activities tomorrow.                            Written discharge instructions were provided to the                            patient.                           - Resume previous diet.                           - Continue present medications.                           - Await pathology results. Maximo Spratling L. Loletha Carrow, MD 07/15/2019 10:27:08 AM This report has been signed electronically.

## 2019-07-17 ENCOUNTER — Telehealth: Payer: Self-pay

## 2019-07-17 NOTE — Telephone Encounter (Signed)
Left message on follow up call. 

## 2019-07-18 ENCOUNTER — Telehealth (INDEPENDENT_AMBULATORY_CARE_PROVIDER_SITE_OTHER): Payer: BLUE CROSS/BLUE SHIELD | Admitting: Family Medicine

## 2019-07-18 ENCOUNTER — Other Ambulatory Visit: Payer: Self-pay

## 2019-07-18 DIAGNOSIS — R1013 Epigastric pain: Secondary | ICD-10-CM | POA: Diagnosis not present

## 2019-07-18 DIAGNOSIS — G4700 Insomnia, unspecified: Secondary | ICD-10-CM | POA: Diagnosis not present

## 2019-07-18 DIAGNOSIS — F329 Major depressive disorder, single episode, unspecified: Secondary | ICD-10-CM

## 2019-07-18 DIAGNOSIS — F419 Anxiety disorder, unspecified: Secondary | ICD-10-CM | POA: Diagnosis not present

## 2019-07-18 NOTE — Progress Notes (Signed)
Virtual Visit via Video Note  I connected with Billy Garcia on 07/18/19 at  2:30 PM EDT by a video enabled telemedicine application 2/2 COVID-19 pandemic and verified that I am speaking with the correct person using two identifiers.  Location patient: home Location provider:work or home office Persons participating in the virtual visit: patient, provider  I discussed the limitations of evaluation and management by telemedicine and the availability of in person appointments. The patient expressed understanding and agreed to proceed.   HPI: Recently seen by GI for epigastric pain. Duodenal ulcer noted on EGD per pt.  Awaiting biopsy results.  Pt taking Omeprazole 40 mg daily, needs refill.    Insomnia:  May fall asleep at 2 am.  Wakes up between 9-10 am.  Has maybe one cup of caffeine per wk.  States the insomnia may have started around the time he started paroxetine.  Taking paroxetine at night for anxiety and depression.  Notes a difference in mood.  Pt notes at times he wakes up with his arms "shaking".  Unsure of when it will happen.  Has not occurred during the day.  Denies h/o seizures.    ROS: See pertinent positives and negatives per HPI.  Past Medical History:  Diagnosis Date  . Perianal abscess, right posterior s/o I&D 10/18/2013 10/18/2013  . 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 HERNIA REPAIR    . INCISION AND DRAINAGE PERIRECTAL ABSCESS  10/17/2013    Family History  Problem Relation Age of Onset  . Hyperlipidemia Father   . Hypertension Maternal Grandmother   . Hyperlipidemia Maternal Grandfather   . Colon cancer Neg Hx   . Stomach cancer Neg Hx   . Rectal cancer Neg Hx   . Esophageal cancer Neg Hx   . Pancreatic cancer Neg Hx      Current Outpatient Medications:  .  omeprazole (PRILOSEC) 40 MG capsule, Take 1 capsule (40 mg total) by mouth daily., Disp: 30 capsule,  Rfl: 3 .  PARoxetine (PAXIL) 20 MG tablet, Take 1 tablet (20 mg total) by mouth daily., Disp: 30 tablet, Rfl: 3 .  Vitamin D, Ergocalciferol, (DRISDOL) 1.25 MG (50000 UT) CAPS capsule, Take 1 capsule (50,000 Units total) by mouth every 7 (seven) days., Disp: 12 capsule, Rfl: 0  EXAM:  VITALS per patient if applicable:  GENERAL: alert, oriented, appears well and in no acute distress  HEENT: atraumatic, conjunctiva clear, no obvious abnormalities on inspection of external nose and ears  NECK: normal movements of the head and neck  LUNGS: on inspection no signs of respiratory distress, breathing rate appears normal, no obvious gross SOB, gasping or wheezing  CV: no obvious cyanosis  MS: moves all visible extremities without noticeable abnormality  PSYCH/NEURO: pleasant and cooperative, no obvious depression or anxiety, speech and thought processing grossly intact  ASSESSMENT AND PLAN:  Discussed the following assessment and plan:  Epigastric pain -Continue follow-up with GI regarding pending biopsy results -Avoid foods known to cause symptoms - Plan: omeprazole (PRILOSEC) 40 MG capsule  Insomnia, unspecified type -Discussed sleep hygiene -Patient advised to try taking paroxetine in a.m. as opposed to evening -Consider melatonin -Follow-up in 1 month  Anxiety and depression -Improving -Continue paroxetine 20 mg -Given sleep issues including insomnia/movement in upper extremity upon waking up patient advised to take medication in a.m. as opposed to evening -We will continue to evaluate -Follow-up in the next few weeks,  sooner if needed   I discussed the assessment and treatment plan with the patient. The patient was provided an opportunity to ask questions and all were answered. The patient agreed with the plan and demonstrated an understanding of the instructions.   The patient was advised to call back or seek an in-person evaluation if the symptoms worsen or if the  condition fails to improve as anticipated.   Billie Ruddy, MD

## 2019-07-22 MED ORDER — OMEPRAZOLE 40 MG PO CPDR: 40.0000 mg | DELAYED_RELEASE_CAPSULE | Freq: Every day | ORAL | 3 refills | Status: DC

## 2019-07-25 ENCOUNTER — Encounter: Payer: Self-pay | Admitting: *Deleted

## 2019-07-25 ENCOUNTER — Other Ambulatory Visit: Payer: Self-pay | Admitting: *Deleted

## 2019-07-25 MED ORDER — OMEPRAZOLE 40 MG PO CPDR: DELAYED_RELEASE_CAPSULE | ORAL | 2 refills | Status: DC

## 2019-08-20 ENCOUNTER — Ambulatory Visit (INDEPENDENT_AMBULATORY_CARE_PROVIDER_SITE_OTHER): Payer: BLUE CROSS/BLUE SHIELD | Admitting: Psychology

## 2019-08-20 DIAGNOSIS — F4322 Adjustment disorder with anxiety: Secondary | ICD-10-CM

## 2019-09-04 ENCOUNTER — Encounter: Payer: Self-pay | Admitting: Gastroenterology

## 2019-09-04 ENCOUNTER — Ambulatory Visit (INDEPENDENT_AMBULATORY_CARE_PROVIDER_SITE_OTHER): Payer: BLUE CROSS/BLUE SHIELD | Admitting: Gastroenterology

## 2019-09-04 VITALS — BP 108/68 | HR 102 | Temp 97.8°F | Ht 76.0 in | Wt 188.0 lb

## 2019-09-04 DIAGNOSIS — R1013 Epigastric pain: Secondary | ICD-10-CM

## 2019-09-04 DIAGNOSIS — K269 Duodenal ulcer, unspecified as acute or chronic, without hemorrhage or perforation: Secondary | ICD-10-CM | POA: Diagnosis not present

## 2019-09-04 NOTE — Progress Notes (Signed)
     [facility] Progress Note  Chief Complaint: Duodenal ulcer  Subjective  History: Seen in June via telemedicine for epigastric pain.  Reported rare NSAID use.  EGD 07/15/2019 with multiple diminutive prepyloric erosions, and multiple shallow ulcers throughout the duodenal bulb and sweep.  Biopsy negative for H. pylori.  Was directed to take twice daily PPI, which he has continued up to now.  Billy Garcia is feeling well these days, with resolution of his epigastric pain.  He might have some dyspeptic symptoms if he eats certain trigger foods like "hot Cheetos".  He denies heartburn, dysphagia, vomiting or weight loss. Aspirin and NSAID-containing OTC meds were again reviewed, and he has not been taking them.  ROS: Cardiovascular:  no chest pain Respiratory: no dyspnea  The patient's Past Medical, Family and Social History were reviewed and are on file in the EMR.  Objective:  Med list reviewed  Current Outpatient Medications:  .  AMBULATORY NON FORMULARY MEDICATION, daily. Medication Name: CBD gummies, Disp: , Rfl:  .  cetirizine (ZYRTEC) 10 MG tablet, Take 10 mg by mouth at bedtime., Disp: , Rfl:  .  omeprazole (PRILOSEC) 40 MG capsule, Take 1 capsule 30-60 minutes before breakfast and before supper., Disp: 60 capsule, Rfl: 2 .  Oxymetazoline HCl (NASAL SPRAY 12 HOUR NA), Place into the nose daily., Disp: , Rfl:  .  PARoxetine (PAXIL) 20 MG tablet, Take 1 tablet (20 mg total) by mouth daily., Disp: 30 tablet, Rfl: 3   Vital signs in last 24 hrs: Vitals:   09/04/19 1408  BP: 108/68  Pulse: (!) 102  Temp: 97.8 F (36.6 C)    Physical Exam  Well-appearing  HEENT: sclera anicteric, oral mucosa moist without lesions  Neck: supple, no thyromegaly, JVD or lymphadenopathy  Cardiac: RRR without murmurs, S1S2 heard, no peripheral edema  Pulm: clear to auscultation bilaterally, normal RR and effort noted  Abdomen: soft, no tenderness, with active bowel sounds. No guarding  or palpable hepatosplenomegaly.  Recent Labs:  Gastric biopsy negative for H. pylori as noted above (taken from both gastric antrum and body)  @ASSESSMENTPLANBEGIN @ Assessment: Encounter Diagnoses  Name Primary?  . Epigastric pain Yes  . Duodenal ulcer    Not clear why he developed duodenal ulcer without H. pylori or reported NSAID use.  Symptoms resolved now, expect his ulcer condition is completely healed by now.   Plan: Decrease omeprazole to once daily for 2 weeks, then stop. Call as needed   Total time 15 minutes, over half spent face-to-face with patient in counseling and coordination of care.   Billy Garcia

## 2019-09-04 NOTE — Patient Instructions (Signed)
If you are age 27 or older, your body mass index should be between 23-30. Your Body mass index is 22.88 kg/m. If this is out of the aforementioned range listed, please consider follow up with your Primary Care Provider.  If you are age 11 or younger, your body mass index should be between 19-25. Your Body mass index is 22.88 kg/m. If this is out of the aformentioned range listed, please consider follow up with your Primary Care Provider.   It was a pleasure to see you today!  Dr. Loletha Carrow

## 2019-10-08 ENCOUNTER — Ambulatory Visit (INDEPENDENT_AMBULATORY_CARE_PROVIDER_SITE_OTHER): Payer: BC Managed Care – PPO | Admitting: Psychology

## 2019-10-08 DIAGNOSIS — F4322 Adjustment disorder with anxiety: Secondary | ICD-10-CM

## 2019-10-13 ENCOUNTER — Other Ambulatory Visit: Payer: Self-pay | Admitting: Family Medicine

## 2019-10-13 DIAGNOSIS — F32A Depression, unspecified: Secondary | ICD-10-CM

## 2019-10-13 DIAGNOSIS — F329 Major depressive disorder, single episode, unspecified: Secondary | ICD-10-CM

## 2019-12-07 ENCOUNTER — Ambulatory Visit: Payer: Self-pay

## 2019-12-07 ENCOUNTER — Ambulatory Visit: Payer: BC Managed Care – PPO | Attending: Internal Medicine

## 2019-12-07 DIAGNOSIS — Z23 Encounter for immunization: Secondary | ICD-10-CM | POA: Insufficient documentation

## 2019-12-07 NOTE — Progress Notes (Signed)
   Covid-19 Vaccination Clinic  Name:  Niklaus Mamaril    MRN: [id number] DOB: [birth year]  12/07/2019  Mr. Soh was observed post Covid-19 immunization for 15 minutes without incident. He was provided with Vaccine Information Sheet and instruction to access the V-Safe system.   Mr. Nicastro was instructed to call 911 with any severe reactions post vaccine: Marland Kitchen Difficulty breathing  . Swelling of face and throat  . A fast heartbeat  . A bad rash all over body  . Dizziness and weakness   Immunizations Administered    Name Date Dose VIS Date Route   Pfizer COVID-19 Vaccine 12/07/2019 12:37 PM 0.3 mL 09/13/2019 Intramuscular   Manufacturer: [employer], [employer]   Lot: [id number]   NDC: [address]-9

## 2019-12-28 ENCOUNTER — Ambulatory Visit: Payer: BC Managed Care – PPO | Attending: Internal Medicine

## 2019-12-28 DIAGNOSIS — Z23 Encounter for immunization: Secondary | ICD-10-CM

## 2019-12-28 NOTE — Progress Notes (Signed)
   Covid-19 Vaccination Clinic  Name:  Billy Garcia    MRN: [id number] DOB: 03-14-1991  12/28/2019  [name] was observed post Covid-19 immunization for 15 minutes without incident. He was provided with Vaccine Information Sheet and instruction to access the V-Safe system.   [name] was instructed to call 911 with any severe reactions post vaccine: Marland Kitchen Difficulty breathing  . Swelling of face and throat  . A fast heartbeat  . A bad rash all over body  . Dizziness and weakness   Immunizations Administered    Name Date Dose VIS Date Route   Pfizer COVID-19 Vaccine 12/28/2019 12:47 PM 0.3 mL 09/13/2019 Intramuscular   Manufacturer: [employer], [employer]   Lot: [id number]   NDC: [address]-2

## 2020-01-08 ENCOUNTER — Encounter: Payer: Self-pay | Admitting: Gastroenterology

## 2020-01-08 ENCOUNTER — Ambulatory Visit: Payer: BC Managed Care – PPO | Admitting: Gastroenterology

## 2020-01-08 ENCOUNTER — Other Ambulatory Visit (INDEPENDENT_AMBULATORY_CARE_PROVIDER_SITE_OTHER): Payer: BC Managed Care – PPO

## 2020-01-08 VITALS — BP 100/70 | HR 59 | Temp 98.3°F | Ht 75.0 in | Wt 175.0 lb

## 2020-01-08 DIAGNOSIS — R1013 Epigastric pain: Secondary | ICD-10-CM

## 2020-01-08 LAB — CBC WITH DIFFERENTIAL/PLATELET
Basophils Absolute: 0 10*3/uL (ref 0.0–0.1)
Basophils Relative: 0.8 % (ref 0.0–3.0)
Eosinophils Absolute: 0.1 10*3/uL (ref 0.0–0.7)
Eosinophils Relative: 2 % (ref 0.0–5.0)
HCT: 43.4 % (ref 39.0–52.0)
Hemoglobin: 14.5 g/dL (ref 13.0–17.0)
Lymphocytes Relative: 32 % (ref 12.0–46.0)
Lymphs Abs: 1.4 10*3/uL (ref 0.7–4.0)
MCHC: 33.3 g/dL (ref 30.0–36.0)
MCV: 95.5 fl (ref 78.0–100.0)
Monocytes Absolute: 0.4 10*3/uL (ref 0.1–1.0)
Monocytes Relative: 8.7 % (ref 3.0–12.0)
Neutro Abs: 2.4 10*3/uL (ref 1.4–7.7)
Neutrophils Relative %: 56.5 % (ref 43.0–77.0)
Platelets: 172 10*3/uL (ref 150.0–400.0)
RBC: 4.54 Mil/uL (ref 4.22–5.81)
RDW: 12.9 % (ref 11.5–15.5)
WBC: 4.3 10*3/uL (ref 4.0–10.5)

## 2020-01-08 NOTE — Progress Notes (Signed)
[facility] Progress Note  Chief Complaint: Epigastric pain and duodenal ulcer  Subjective  History: Last office visit 09/04/2019 for intermittent epigastric pain. EGD October 2020 with multiple shallow prepyloric erosions and multiple shallow duodenal bulb ulcers. H. pylori negative, patient denied aspirin or NSAID use.  Plan at last visit was to wean off PPI over the next few weeks.  He came today because several days ago he had abdominal cramps with loose stool as well as black stool.  When he told his mother about this, she was concerned it might be bleeding from the ulcer and an appointment was made.  Black stool has resolved the symptoms are all getting better. 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  1 day he consumed all of those things and now he thinks that may have triggered the symptoms last weekend. 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  When asked how he was feeling since I last saw him, he told me all about breaking up with his girlfriend in January, becoming depressed and losing his appetite for some time.  He was then taking some product he gets at a smoke shop which he characterizes as "weak weed", but sounds again may have stopped that recently.  He has not been taking aspirin or NSAIDs.  ROS: Cardiovascular:  no chest pain Respiratory: no dyspnea  The patient's Past Medical, Family and Social History were reviewed and are on file in the EMR.  Objective:  Med list reviewed  Current Outpatient Medications:  .  AMBULATORY NON FORMULARY MEDICATION, daily. Medication Name: CBD gummies, Disp: , Rfl:  .  cetirizine (ZYRTEC) 10 MG tablet, Take 10 mg by mouth at bedtime., Disp: , Rfl:  .  omeprazole (PRILOSEC) 40 MG capsule, Take 1 capsule 30-60 minutes before breakfast and before supper., Disp: 60 capsule, Rfl: 2 .   Oxymetazoline HCl (NASAL SPRAY 12 HOUR NA), Place into the nose daily., Disp: , Rfl:  .  PARoxetine (PAXIL) 20 MG tablet, TAKE 1 TABLET(20 MG) BY MOUTH DAILY, Disp: 30 tablet, Rfl: 3   Vital signs in last 24 hrs: Vitals:   01/08/20 1406  BP: 100/70  Pulse: (!) 59  Temp: 98.3 F (36.8 C)    Physical Exam  Thin as before, poor eye contact  HEENT: sclera anicteric, oral mucosa moist without lesions  Neck: supple, no thyromegaly, JVD or lymphadenopathy  Cardiac: RRR without murmurs, S1S2 heard, no peripheral edema  Pulm: clear to auscultation bilaterally, normal RR and effort noted  Abdomen: soft, no tenderness, with active bowel sounds. No guarding or palpable hepatosplenomegaly.  Skin; warm and dry, no jaundice or rash  Labs:   ___________________________________________ Radiologic studies:   ____________________________________________ Other:   _____________________________________________ Assessment & Plan  Assessment: Encounter Diagnosis  Name Primary?  . Epigastric pain Yes  Black stool, probably not melena.  I think he may have had the change in stool character and color from something he ate or perhaps a mild infectious illness.  Less likely from bleeding ulcer.  He needs to make some healthier diet and lifestyle choices.  Because of his duodenal ulcers not entirely clear, though I think he may be consuming something that he is either not reporting or perhaps does not fully know the ingredients of.  Plan: Continue result 20 mg every other day since he reports  it helps with his epigastric burning and his appetite.  CBC today.  20 minutes were spent on this encounter (including chart review, history/exam, counseling/coordination of care, and documentation)  Charlie Pitter III

## 2020-01-08 NOTE — Patient Instructions (Signed)
If you are age 28 or older, your body mass index should be between 23-30. Your Body mass index is 21.87 kg/m. If this is out of the aforementioned range listed, please consider follow up with your Primary Care Provider.  If you are age 23 or younger, your body mass index should be between 19-25. Your Body mass index is 21.87 kg/m. If this is out of the aformentioned range listed, please consider follow up with your Primary Care Provider.   Your provider has requested that you go to the basement level for lab work before leaving today. Press "B" on the elevator. The lab is located at the first door on the left as you exit the elevator.  It was a pleasure to see you today!  Dr. Myrtie Neither

## 2020-02-25 ENCOUNTER — Other Ambulatory Visit: Payer: Self-pay | Admitting: Family Medicine

## 2020-02-25 DIAGNOSIS — F32A Depression, unspecified: Secondary | ICD-10-CM

## 2020-02-25 NOTE — Telephone Encounter (Signed)
Left a message for pt to call the office for a f/u visit for his Paxil med renew

## 2020-02-25 NOTE — Telephone Encounter (Signed)
Pt is scheduled for a medication refill f/u on 03/04/2020

## 2020-03-04 ENCOUNTER — Telehealth (INDEPENDENT_AMBULATORY_CARE_PROVIDER_SITE_OTHER): Payer: BC Managed Care – PPO | Admitting: Family Medicine

## 2020-03-04 ENCOUNTER — Encounter: Payer: Self-pay | Admitting: Family Medicine

## 2020-03-04 DIAGNOSIS — F321 Major depressive disorder, single episode, moderate: Secondary | ICD-10-CM

## 2020-03-04 DIAGNOSIS — R5383 Other fatigue: Secondary | ICD-10-CM | POA: Diagnosis not present

## 2020-03-04 DIAGNOSIS — F419 Anxiety disorder, unspecified: Secondary | ICD-10-CM | POA: Diagnosis not present

## 2020-03-04 MED ORDER — PAROXETINE HCL 30 MG PO TABS: 30.0000 mg | ORAL_TABLET | Freq: Every day | ORAL | 3 refills | Status: DC

## 2020-03-04 NOTE — Progress Notes (Signed)
Virtual Visit via Video Note  I connected with Delsin Copen on 03/04/20 at  1:30 PM EDT by a video enabled telemedicine application 2/2 COVID-19 pandemic and verified that I am speaking with the correct person using two identifiers.  Location patient: home Location provider:work or home office Persons participating in the virtual visit: patient, provider  I discussed the limitations of evaluation and management by telemedicine and the availability of in person appointments. The patient expressed understanding and agreed to proceed.   HPI: Pt notes increased fatigue. Goes to bed at midnight.  Wakes up 2-3 x per night.  May get up at 7am, but will go back to sleep until 10 am.  Pt notes increased anxiety and depression.  Pt went through a breakup at the end or the year which was difficult.  Pt taking paxil 20 mg daily.  At one point he tried to stop cold [place], but had withdrawal.  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  Trying to get more done in the am, but note motivated.  Pt had a piece of glass stuck in his finger that was chronically swollen.  Since removal of the glass, the edema of the finger has improved.   ROS: See pertinent positives and negatives per HPI.  Past Medical History:  Diagnosis Date  . Perianal abscess, right posterior s/o I&D 10/18/2013 10/18/2013  . 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 HERNIA REPAIR    . INCISION AND DRAINAGE PERIRECTAL ABSCESS  10/17/2013    Family History  Problem Relation Age of Onset  . Hyperlipidemia Father   . Hypertension Maternal Grandmother   . Hyperlipidemia Maternal Grandfather   . Colon cancer Neg Hx   . Stomach cancer Neg Hx   . Rectal cancer Neg Hx   . Esophageal cancer Neg Hx   . Pancreatic cancer Neg Hx   Pt working at [employer].  In a coding boot camp class.     Current Outpatient  Medications:  .  AMBULATORY NON FORMULARY MEDICATION, daily. Medication Name: CBD gummies, Disp: , Rfl:  .  cetirizine (ZYRTEC) 10 MG tablet, Take 10 mg by mouth at bedtime., Disp: , Rfl:  .  omeprazole (PRILOSEC) 40 MG capsule, Take 1 capsule 30-60 minutes before breakfast and before supper., Disp: 60 capsule, Rfl: 2 .  Oxymetazoline HCl (NASAL SPRAY 12 HOUR NA), Place into the nose daily., Disp: , Rfl:  .  PARoxetine (PAXIL) 20 MG tablet, TAKE 1 TABLET(20 MG) BY MOUTH DAILY, Disp: 30 tablet, Rfl: 3  EXAM:  VITALS per patient if applicable:  RR between 12-20 bpm  GENERAL: alert, oriented, appears well and in no acute distress  HEENT: atraumatic, conjunctiva clear, no obvious abnormalities on inspection of external nose and ears  NECK: normal movements of the head and neck  LUNGS: on inspection no signs of respiratory distress, breathing rate appears normal, no obvious gross SOB, gasping or wheezing  CV: no obvious cyanosis  MS: moves all visible extremities without noticeable abnormality  PSYCH/NEURO: pleasant and cooperative, no obvious depression or anxiety, speech and thought processing grossly intact  ASSESSMENT AND PLAN:  Discussed the following assessment and plan:  Depression, major, single episode, moderate (HCC)  -PHQ 9 score 15 -will increase Paxil from 20 mg to 30 mg -continue counseling with Maggie Font.  Would benefit from more frequent visits but costs is an  issue.   - Plan: PARoxetine (PAXIL) 30 MG tablet  Anxiety  -GAD 7 score 8 -continue counseling -discussed ways to reduce stress and anxiety -will d/c Paxil 20 mg and increased dose to 30 mg daily. - Plan: PARoxetine (PAXIL) 30 MG tablet  Fatigue -possibly 2/2 increased depression. -discussed obtaining labs at next [facility] including TSH, Free T4, vit D, CBC  F/u in 4-6 wks for CPE   I discussed the assessment and treatment plan with the patient. The patient was provided an opportunity to ask questions  and all were answered. The patient agreed with the plan and demonstrated an understanding of the instructions.   The patient was advised to call back or seek an in-person evaluation if the symptoms worsen or if the condition fails to improve as anticipated.  I provided 31 minutes of non-face-to-face time during this encounter.   Billie Ruddy, MD

## 2020-04-14 ENCOUNTER — Ambulatory Visit (INDEPENDENT_AMBULATORY_CARE_PROVIDER_SITE_OTHER): Payer: BC Managed Care – PPO | Admitting: Psychology

## 2020-04-14 DIAGNOSIS — F4322 Adjustment disorder with anxiety: Secondary | ICD-10-CM

## 2020-05-07 ENCOUNTER — Ambulatory Visit: Payer: BC Managed Care – PPO | Admitting: Family Medicine

## 2020-05-07 ENCOUNTER — Encounter: Payer: Self-pay | Admitting: Family Medicine

## 2020-05-07 ENCOUNTER — Other Ambulatory Visit: Payer: Self-pay

## 2020-05-07 VITALS — BP 118/78 | HR 65 | Temp 98.4°F | Wt 189.6 lb

## 2020-05-07 DIAGNOSIS — R519 Headache, unspecified: Secondary | ICD-10-CM

## 2020-05-07 DIAGNOSIS — F322 Major depressive disorder, single episode, severe without psychotic features: Secondary | ICD-10-CM

## 2020-05-07 DIAGNOSIS — K2900 Acute gastritis without bleeding: Secondary | ICD-10-CM

## 2020-05-07 DIAGNOSIS — R634 Abnormal weight loss: Secondary | ICD-10-CM

## 2020-05-07 DIAGNOSIS — F411 Generalized anxiety disorder: Secondary | ICD-10-CM | POA: Diagnosis not present

## 2020-05-07 DIAGNOSIS — R5383 Other fatigue: Secondary | ICD-10-CM

## 2020-05-07 DIAGNOSIS — D510 Vitamin B12 deficiency anemia due to intrinsic factor deficiency: Secondary | ICD-10-CM | POA: Diagnosis not present

## 2020-05-07 DIAGNOSIS — E559 Vitamin D deficiency, unspecified: Secondary | ICD-10-CM | POA: Diagnosis not present

## 2020-05-07 NOTE — Progress Notes (Signed)
Subjective:    Patient ID: Billy Garcia, male    DOB: 03-12-1991, 29 y.o.   MRN: [id number]  No chief complaint on file.   HPI Pt is a 29 yo male with pmh sig for anxiety, depression, GERD who was seen for f/u.  Pt notes increased anxiety and depression.  Taking Paxil 30 mg daily.  Feels medication helps some as "would not like to know what [it] feels like without medication".  Pt denies SI/HI, states he would "never hurt himself".  Pt in counseling with Dennison Bulla, [facility], however has been unable to go 2/2 cost of sessions.  When feeling like a panic attack is starting patient will take deep breaths or call a friend.  Pt notes difficulty sleeping, decreased appetite, and burning sensation in stomach.  Denies n/v, diarrhea, constipation, increased flatus.  Pt thinks stress is causing him to have another ulcer.  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  Pt also notes stress with finding time for school.  Pt is studying coding.  Living with his parents.  Pt may eat once a day and is working out more.  Eating 2 wraps, fruit.  Pt states he is not gaining weight.  Pt notes continued pain in back of head s/p accident at work.  Pt hit his head on a machine while at [employer] several months ago.  Pt has been following with [facility] for Work[facility]'s Comp.  Pt states he is supposed to get a referral to neurology but has not heard anything about this from them.  Head sore to touch when twisting his hair while nervous/anxious.  Pt denies HAs, dizziness, neck pain, changes in vision.  Past Medical History:  Diagnosis Date  . Perianal abscess, right posterior s/o I&D 10/18/2013 10/18/2013  . 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 12/04/2013    No Known Allergies  ROS General: Denies fever, chills, night sweats, changes in weight, changes in appetite  + soreness in occipital area of head, decreased  appetite, weight loss HEENT: Denies headaches, ear pain, changes in vision, rhinorrhea, sore throat CV: Denies CP, palpitations, SOB, orthopnea Pulm: Denies SOB, cough, wheezing GI: Denies abdominal pain, nausea, vomiting, diarrhea, constipation  + burning in stomach GU: Denies dysuria, hematuria, frequency Msk: Denies muscle cramps, joint pains Neuro: Denies weakness, numbness, tingling  + soreness in back of head. Skin: Denies rashes, bruising Psych: Denies hallucinations + anxiety, depression      Objective:    Blood pressure 118/78, pulse 65, temperature 98.4 F (36.9 C), temperature source Oral, weight 189 lb 9.6 oz (86 kg), SpO2 95 %.   Gen. Pleasant, well-nourished, in no distress, anxious HEENT: [place]/AT, face symmetric, conjunctiva clear, no scleral icterus, PERRLA, EOMI, nares patent without drainage Lungs: no accessory muscle use, CTA, no wheezes or rales Cardiovascular: RRR, no m/r/g, no peripheral edema Abdomen: BS present, soft,ND Musculoskeletal: No deformities, no cyanosis or clubbing, normal tone Neuro:  A&Ox3, CN II-XII intact, normal gait Skin:  Warm, no lesions/ rash Psych: Well groomed, neatly dressed, good eye contact at times, increased anxiety, fidgety, pressured speech with difficulty at times answering questions.    Wt Readings from Last 3 Encounters:  01/08/20 175 lb (79.4 kg)  09/04/19 188 lb (85.3 kg)  07/15/19 188 lb (85.3 kg)    Lab Results  Component Value Date   WBC 4.3 01/08/2020   HGB 14.5 01/08/2020  HCT 43.4 01/08/2020   PLT 172.0 01/08/2020   TSH 0.74 04/17/2019    Assessment/Plan:  GAD (generalized anxiety disorder) -GAD-7 score 22.  Was 15 on 03/04/2020 -Patient encouraged to continue deep breathing exercises and speaking with friends to help calm him down when feeling anxious -Also discussed the importance of regular counseling -Patient encouraged to contact employer regards to possible EAP and his insurance company in regards to  psychiatrist in network. -Continue Paxil 30 mg.   -Discussed increasing dose to 40 mg.  Pt declines at this time. -Also consider switching medication, however given increasing symptoms would be hesitant to do so at this time without consistent [place] support in place. -Given handouts -Given precautions -We will obtain labs  - Plan: TSH, T4, Free  Depression, major, single episode, severe (HCC) -PHQ-9 score 18 was 8 on 03/04/2020 -Discussed ways to help improve mood to include and continuing to exercise, getting some fresh air, spend time with friends and family -Counseling strongly encouraged. -Consider EAP and contacting insurance for area of psychiatrist in network -Continue Paxil 30 mg -Consider increasing dose of Paxil to 40 mg.  Pt declines at this time -Consider switching medications, however given increasing symptoms would be hesitant to do so at this time without consistent BH support in place -Given precautions - Plan: TSH, T4, Free  Occipital pain -s/p Workmen's Comp. injury -encouraged to avoid pressing on head if possible -encouraged to follow-up with [facility]/Workmen's Comp. provider in regards to neurology referral. -We will continue to monitor -Given precautions  Fatigue, unspecified type  -Discussed possible causes including lack of sleep 2/2 depression.  Also consider vitamin deficiency, thyroid dysfunction-we will obtain labs -Continue Paxil 30 mg - Plan: CBC (no diff), Vitamin D, 25-hydroxy, Vitamin B12, Vitamin B12, Vitamin D, 25-hydroxy, CBC (no diff)  Acute gastritis without hemorrhage, unspecified gastritis type  -Discussed possible causes including gastric ulcer 2/2 stress, GERD, peptic ulcer -Increased dose of Prilosec to 40 mg twice daily for the next 10 days -Discussed referral to GI.  Pt wishes to wait on referral. -Given precautions - Plan: CBC (no diff), CMP with eGFR([facility]), Lipase, Lipase, CMP with eGFR([facility]), CBC (no diff)  Weight loss -Currently  189.6 lbs -Discussed possible causes including decreased intake and increased physical activity, decrease intake 2/2 possible ulcer -Patient advised to increase protein intake and eating very light 1-2 times per day -Discussed eating several small meals and snacks throughout the day -Given handout -We will continue to monitor  F/u in 2-4 wks, sooner if needed  Grier Mitts, MD

## 2020-05-07 NOTE — Patient Instructions (Signed)
Fatigue If you have fatigue, you feel tired all the time and have a lack of energy or a lack of motivation. Fatigue may make it difficult to start or complete tasks because of exhaustion. In general, occasional or mild fatigue is often a normal response to activity or life. However, long-lasting (chronic) or extreme fatigue may be a symptom of a medical condition. Follow these instructions at home: General instructions  Watch your fatigue for any changes.  Go to bed and get up at the same time every day.  Avoid fatigue by pacing yourself during the day and getting enough sleep at night.  Maintain a healthy weight. Medicines  Take over-the-counter and prescription medicines only as told by your health care provider.  Take a multivitamin, if told by your health care provider.  Do not use herbal or dietary supplements unless they are approved by your health care provider. Activity   Exercise regularly, as told by your health care provider.  Use or practice techniques to help you relax, such as yoga, tai chi, meditation, or massage therapy. Eating and drinking   Avoid heavy meals in the evening.  Eat a well-balanced diet, which includes lean proteins, whole grains, plenty of fruits and vegetables, and low-fat dairy products.  Avoid consuming too much caffeine.  Avoid the use of alcohol.  Drink enough fluid to keep your urine pale yellow. Lifestyle  Change situations that cause you stress. Try to keep your work and personal schedule in balance.  Do not use any products that contain nicotine or tobacco, such as cigarettes and e-cigarettes. If you need help quitting, ask your health care provider.  Do not use drugs. Contact a health care provider if:  Your fatigue does not get better.  You have a fever.  You suddenly lose or gain weight.  You have headaches.  You have trouble falling asleep or sleeping through the night.  You feel angry, guilty, anxious, or  sad.  You are unable to have a bowel movement (constipation).  Your skin is dry.  You have swelling in your legs or another part of your body. Get help right away if:  You feel confused.  Your vision is blurry.  You feel faint or you pass out.  You have a severe headache.  You have severe pain in your abdomen, your back, or the area between your waist and hips (pelvis).  You have chest pain, shortness of breath, or an irregular or fast heartbeat.  You are unable to urinate, or you urinate less than normal.  You have abnormal bleeding, such as bleeding from the rectum, vagina, nose, lungs, or nipples.  You vomit blood.  You have thoughts about hurting yourself or others. If you ever feel like you may hurt yourself or others, or have thoughts about taking your own life, get help right away. You can go to your nearest emergency department or call:  Your local emergency services (911 in the [place]  A suicide crisis helpline, such as the [facility] at 9164775271. This is open 24 hours a day. Summary  If you have fatigue, you feel tired all the time and have a lack of energy or a lack of motivation.  Fatigue may make it difficult to start or complete tasks because of exhaustion.  Long-lasting (chronic) or extreme fatigue may be a symptom of a medical condition.  Exercise regularly, as told by your health care provider.  Change situations that cause you stress. Try to keep your  work and personal schedule in balance. This information is not intended to replace advice given to you by your health care provider. Make sure you discuss any questions you have with your health care provider. Document Revised: 04/10/2019 Document Reviewed: 06/14/2017 Elsevier Patient Education  [place].  Generalized Anxiety Disorder, Adult Generalized anxiety disorder (GAD) is a mental health disorder. People with this condition constantly worry about everyday  events. Unlike normal anxiety, worry related to GAD is not triggered by a specific event. These worries also do not fade or get better with time. GAD interferes with life functions, including relationships, work, and school. GAD can vary from mild to severe. People with severe GAD can have intense waves of anxiety with physical symptoms (panic attacks). What are the causes? The exact cause of GAD is not known. What increases the risk? This condition is more likely to develop in:  Women.  People who have a family history of anxiety disorders.  People who are very shy.  People who experience very stressful life events, such as the death of a loved one.  People who have a very stressful family environment. What are the signs or symptoms? People with GAD often worry excessively about many things in their lives, such as their health and family. They may also be overly concerned about:  Doing well at work.  Being on time.  Natural disasters.  Friendships. Physical symptoms of GAD include:  Fatigue.  Muscle tension or having muscle twitches.  Trembling or feeling shaky.  Being easily startled.  Feeling like your heart is pounding or racing.  Feeling out of breath or like you cannot take a deep breath.  Having trouble falling asleep or staying asleep.  Sweating.  Nausea, diarrhea, or irritable bowel syndrome (IBS).  Headaches.  Trouble concentrating or remembering facts.  Restlessness.  Irritability. How is this diagnosed? Your health care provider can diagnose GAD based on your symptoms and medical history. You will also have a physical exam. The health care provider will ask specific questions about your symptoms, including how severe they are, when they started, and if they come and go. Your health care provider may ask you about your use of alcohol or drugs, including prescription medicines. Your health care provider may refer you to a mental health specialist for  further evaluation. Your health care provider will do a thorough examination and may perform additional tests to rule out other possible causes of your symptoms. To be diagnosed with GAD, a person must have anxiety that:  Is out of his or her control.  Affects several different aspects of his or her life, such as work and relationships.  Causes distress that makes him or her unable to take part in normal activities.  Includes at least three physical symptoms of GAD, such as restlessness, fatigue, trouble concentrating, irritability, muscle tension, or sleep problems. Before your health care provider can confirm a diagnosis of GAD, these symptoms must be present more days than they are not, and they must last for six months or longer. How is this treated? The following therapies are usually used to treat GAD:  Medicine. Antidepressant medicine is usually prescribed for long-term daily control. Antianxiety medicines may be added in severe cases, especially when panic attacks occur.  Talk therapy (psychotherapy). Certain types of talk therapy can be helpful in treating GAD by providing support, education, and guidance. Options include: ? Cognitive behavioral therapy (CBT). People learn coping skills and techniques to ease their anxiety. They learn  to identify unrealistic or negative thoughts and behaviors and to replace them with positive ones. ? Acceptance and commitment therapy (ACT). This treatment teaches people how to be mindful as a way to cope with unwanted thoughts and feelings. ? Biofeedback. This process trains you to manage your body's response (physiological response) through breathing techniques and relaxation methods. You will work with a therapist while machines are used to monitor your physical symptoms.  Stress management techniques. These include yoga, meditation, and exercise. A mental health specialist can help determine which treatment is best for you. Some people see  improvement with one type of therapy. However, other people require a combination of therapies. Follow these instructions at home:  Take over-the-counter and prescription medicines only as told by your health care provider.  Try to maintain a normal routine.  Try to anticipate stressful situations and allow extra time to manage them.  Practice any stress management or self-calming techniques as taught by your health care provider.  Do not punish yourself for setbacks or for not making progress.  Try to recognize your accomplishments, even if they are small.  Keep all follow-up visits as told by your health care provider. This is important. Contact a health care provider if:  Your symptoms do not get better.  Your symptoms get worse.  You have signs of depression, such as: ? A persistently sad, cranky, or irritable mood. ? Loss of enjoyment in activities that used to bring you joy. ? Change in weight or eating. ? Changes in sleeping habits. ? Avoiding friends or family members. ? Loss of energy for normal tasks. ? Feelings of guilt or worthlessness. Get help right away if:  You have serious thoughts about hurting yourself or others. If you ever feel like you may hurt yourself or others, or have thoughts about taking your own life, get help right away. You can go to your nearest emergency department or call:  Your local emergency services (911 in the [place]  A suicide crisis helpline, such as the [facility] at 947-763-5128. This is open 24 hours a day. Summary  Generalized anxiety disorder (GAD) is a mental health disorder that involves worry that is not triggered by a specific event.  People with GAD often worry excessively about many things in their lives, such as their health and family.  GAD may cause physical symptoms such as restlessness, trouble concentrating, sleep problems, frequent sweating, nausea, diarrhea, headaches, and trembling or  muscle twitching.  A mental health specialist can help determine which treatment is best for you. Some people see improvement with one type of therapy. However, other people require a combination of therapies. This information is not intended to replace advice given to you by your health care provider. Make sure you discuss any questions you have with your health care provider. Document Revised: 09/01/2017 Document Reviewed: 08/09/2016 Elsevier Patient Education  [place].  Major Depressive Disorder, Adult Major depressive disorder (MDD) is a mental health condition. It may also be called clinical depression or unipolar depression. MDD usually causes feelings of sadness, hopelessness, or helplessness. MDD can also cause physical symptoms. It can interfere with work, school, relationships, and other everyday activities. MDD may be mild, moderate, or severe. It may occur once (single episode major depressive disorder) or it may occur multiple times (recurrent major depressive disorder). What are the causes? The exact cause of this condition is not known. MDD is most likely caused by a combination of things, which may include:  Genetic factors. These are traits that are passed along from parent to child.  Individual factors. Your personality, your behavior, and the way you handle your thoughts and feelings may contribute to MDD. This includes personality traits and behaviors learned from others.  Physical factors, such as: ? Differences in the part of your brain that controls emotion. This part of your brain may be different than it is in people who do not have MDD. ? Long-term (chronic) medical or psychiatric illnesses.  Social factors. Traumatic experiences or major life changes may play a role in the development of MDD. What increases the risk? This condition is more likely to develop in women. The following factors may also make you more likely to develop MDD:  A family history of  depression.  Troubled family relationships.  Abnormally low levels of certain brain chemicals.  Traumatic events in childhood, especially abuse or the loss of a parent.  Being under a lot of stress, or long-term stress, especially from upsetting life experiences or losses.  A history of: ? Chronic physical illness. ? Other mental health disorders. ? Substance abuse.  Poor living conditions.  Experiencing social exclusion or discrimination on a regular basis. What are the signs or symptoms? The main symptoms of MDD typically include:  Constant depressed or irritable mood.  Loss of interest in things and activities. MDD symptoms may also include:  Sleeping or eating too much or too little.  Unexplained weight change.  Fatigue or low energy.  Feelings of worthlessness or guilt.  Difficulty thinking clearly or making decisions.  Thoughts of suicide or of harming others.  Physical agitation or weakness.  Isolation. Severe cases of MDD may also occur with other symptoms, such as:  Delusions or hallucinations, in which you imagine things that are not real (psychotic depression).  Low-level depression that lasts at least a year (chronic depression or persistent depressive disorder).  Extreme sadness and hopelessness (melancholic depression).  Trouble speaking and moving (catatonic depression). How is this diagnosed? This condition may be diagnosed based on:  Your symptoms.  Your medical history, including your mental health history. This may involve tests to evaluate your mental health. You may be asked questions about your lifestyle, including any drug and alcohol use, and how long you have had symptoms of MDD.  A physical exam.  Blood tests to rule out other conditions. You must have a depressed mood and at least four other MDD symptoms most of the day, nearly every day in the same 2-week timeframe before your health care provider can confirm a diagnosis of  MDD. How is this treated? This condition is usually treated by mental health professionals, such as psychologists, psychiatrists, and clinical social workers. You may need more than one type of treatment. Treatment may include:  Psychotherapy. This is also called talk therapy or counseling. Types of psychotherapy include: ? Cognitive behavioral therapy (CBT). This type of therapy teaches you to recognize unhealthy feelings, thoughts, and behaviors, and replace them with positive thoughts and actions. ? Interpersonal therapy (IPT). This helps you to improve the way you relate to and communicate with others. ? Family therapy. This treatment includes members of your family.  Medicine to treat anxiety and depression, or to help you control certain emotions and behaviors.  Lifestyle changes, such as: ? Limiting alcohol and drug use. ? Exercising regularly. ? Getting plenty of sleep. ? Making healthy eating choices. ? Spending more time outdoors.  Treatments involving stimulation of the brain can be used in  situations with extremely severe symptoms, or when medicine or other therapies do not work over time. These treatments include electroconvulsive therapy, transcranial magnetic stimulation, and vagal nerve stimulation. Follow these instructions at home: Activity  Return to your normal activities as told by your health care provider.  Exercise regularly and spend time outdoors as told by your health care provider. General instructions  Take over-the-counter and prescription medicines only as told by your health care provider.  Do not drink alcohol. If you drink alcohol, limit your alcohol intake to no more than 1 drink a day for nonpregnant women and 2 drinks a day for men. One drink equals 12 oz of beer, 5 oz of wine, or 1 oz of hard liquor. Alcohol can affect any antidepressant medicines you are taking. Talk to your health care provider about your alcohol use.  Eat a healthy diet and get  plenty of sleep.  Find activities that you enjoy doing, and make time to do them.  Consider joining a support group. Your health care provider may be able to recommend a support group.  Keep all follow-up visits as told by your health care provider. This is important. Where to find more information [employer] on Mental Illness  www.nami.org [place] National Institute of Mental Health  https://carter.com/ National Suicide Prevention Lifeline  1-800-273-TALK 323-253-0790). This is free, 24-hour help. Contact a health care provider if:  Your symptoms get worse.  You develop new symptoms. Get help right away if:  You self-harm.  You have serious thoughts about hurting yourself or others.  You see, hear, taste, smell, or feel things that are not present (hallucinate). This information is not intended to replace advice given to you by your health care provider. Make sure you discuss any questions you have with your health care provider. Document Revised: 09/01/2017 Document Reviewed: 03/30/2016 Elsevier Patient Education  [place].  Gastritis, Adult  Gastritis is swelling (inflammation) of the stomach. Gastritis can develop quickly (acute). It can also develop slowly over time (chronic). It is important to get help for this condition. If you do not get help, your stomach can bleed, and you can get sores (ulcers) in your stomach. What are the causes? This condition may be caused by:  Germs that get to your stomach.  Drinking too much alcohol.  Medicines you are taking.  Too much acid in the stomach.  A disease of the intestines or stomach.  Stress.  An allergic reaction.  Crohn's disease.  Some cancer treatments (radiation). Sometimes the cause of this condition is not known. What are the signs or symptoms? Symptoms of this condition include:  Pain in your stomach.  A burning feeling in your stomach.  Feeling sick to your stomach (nauseous).  Throwing up  (vomiting).  Feeling too full after you eat.  Weight loss.  Bad breath.  Throwing up blood.  Blood in your poop (stool). How is this diagnosed? This condition may be diagnosed with:  Your medical history and symptoms.  A physical exam.  Tests. These can include: ? Blood tests. ? Stool tests. ? A procedure to look inside your stomach (upper endoscopy). ? A test in which a sample of tissue is taken for testing (biopsy). How is this treated? Treatment for this condition depends on what caused it. You may be given:  Antibiotic medicine, if your condition was caused by germs.  H2 blockers and similar medicines, if your condition was caused by too much acid. Follow these instructions at home:  Medicines  Take over-the-counter and prescription medicines only as told by your doctor.  If you were prescribed an antibiotic medicine, take it as told by your doctor. Do not stop taking it even if you start to feel better. Eating and drinking   Eat small meals often, instead of large meals.  Avoid foods and drinks that make your symptoms worse.  Drink enough fluid to keep your pee (urine) pale yellow. Alcohol use  Do not drink alcohol if: ? Your doctor tells you not to drink. ? You are pregnant, may be pregnant, or are planning to become pregnant.  If you drink alcohol: ? Limit your use to:  0-1 drink a day for women.  0-2 drinks a day for men. ? Be aware of how much alcohol is in your drink. In the [place] one drink equals one 12 oz bottle of beer (355 mL), one 5 oz glass of wine (148 mL), or one 1 oz glass of hard liquor (44 mL). General instructions  Talk with your doctor about ways to manage stress. You can exercise or do deep breathing, meditation, or yoga.  Do not smoke or use products that have nicotine or tobacco. If you need help quitting, ask your doctor.  Keep all follow-up visits as told by your doctor. This is important. Contact a doctor if:  Your symptoms  get worse.  Your symptoms go away and then come back. Get help right away if:  You throw up blood or something that looks like coffee grounds.  You have black or dark red poop.  You throw up any time you try to drink fluids.  Your stomach pain gets worse.  You have a fever.  You do not feel better after one week. Summary  Gastritis is swelling (inflammation) of the stomach.  You must get help for this condition. If you do not get help, your stomach can bleed, and you can get sores (ulcers).  This condition is diagnosed with medical history, physical exam, or tests.  You can be treated with medicines for germs or medicines to block too much acid in your stomach. This information is not intended to replace advice given to you by your health care provider. Make sure you discuss any questions you have with your health care provider. Document Revised: 02/06/2018 Document Reviewed: 02/06/2018 Elsevier Patient Education  [place].

## 2020-05-08 LAB — CBC
HCT: 41.7 % (ref 38.5–50.0)
Hemoglobin: 14.3 g/dL (ref 13.2–17.1)
MCH: 31.8 pg (ref 27.0–33.0)
MCHC: 34.3 g/dL (ref 32.0–36.0)
MCV: 92.9 fL (ref 80.0–100.0)
MPV: 11.6 fL (ref 7.5–12.5)
Platelets: 169 10*3/uL (ref 140–400)
RBC: 4.49 10*6/uL (ref 4.20–5.80)
RDW: 11.9 % (ref 11.0–15.0)
WBC: 3.9 10*3/uL (ref 3.8–10.8)

## 2020-05-08 LAB — TSH: TSH: 0.8 mIU/L (ref 0.40–4.50)

## 2020-05-08 LAB — COMPLETE METABOLIC PANEL WITH GFR
AG Ratio: 1.4 (calc) (ref 1.0–2.5)
ALT: 10 U/L (ref 9–46)
AST: 19 U/L (ref 10–40)
Albumin: 4 g/dL (ref 3.6–5.1)
Alkaline phosphatase (APISO): 73 U/L (ref 36–130)
BUN: 10 mg/dL (ref 7–25)
CO2: 31 mmol/L (ref 20–32)
Calcium: 9 mg/dL (ref 8.6–10.3)
Chloride: 103 mmol/L (ref 98–110)
Creat: 0.97 mg/dL (ref 0.60–1.35)
GFR, Est African American: 123 mL/min/{1.73_m2} (ref >=60)
GFR, Est Non African American: 107 mL/min/{1.73_m2} (ref >=60)
Globulin: 2.9 g/dL (calc) (ref 1.9–3.7)
Glucose, Bld: 87 mg/dL (ref 65–99)
Potassium: 3.7 mmol/L (ref 3.5–5.3)
Sodium: 140 mmol/L (ref 135–146)
Total Bilirubin: 0.8 mg/dL (ref 0.2–1.2)
Total Protein: 6.9 g/dL (ref 6.1–8.1)

## 2020-05-08 LAB — LIPASE: Lipase: 53 U/L (ref 7–60)

## 2020-05-08 LAB — VITAMIN B12: Vitamin B-12: 524 pg/mL (ref 200–1100)

## 2020-05-08 LAB — VITAMIN D 25 HYDROXY (VIT D DEFICIENCY, FRACTURES): Vit D, 25-Hydroxy: 21 ng/mL — ABNORMAL LOW (ref 30–100)

## 2020-05-08 LAB — T4, FREE: Free T4: 1.1 ng/dL (ref 0.8–1.8)

## 2020-05-11 ENCOUNTER — Other Ambulatory Visit: Payer: Self-pay | Admitting: Family Medicine

## 2020-05-11 ENCOUNTER — Encounter: Payer: Self-pay | Admitting: Family Medicine

## 2020-05-11 DIAGNOSIS — E559 Vitamin D deficiency, unspecified: Secondary | ICD-10-CM

## 2020-05-11 MED ORDER — VITAMIN D (ERGOCALCIFEROL) 1.25 MG (50000 UNIT) PO CAPS: 50000.0000 [IU] | ORAL_CAPSULE | ORAL | 0 refills | Status: DC

## 2020-07-01 ENCOUNTER — Ambulatory Visit (INDEPENDENT_AMBULATORY_CARE_PROVIDER_SITE_OTHER): Payer: BC Managed Care – PPO | Admitting: Family Medicine

## 2020-07-01 ENCOUNTER — Other Ambulatory Visit: Payer: Self-pay

## 2020-07-01 ENCOUNTER — Encounter: Payer: Self-pay | Admitting: Family Medicine

## 2020-07-01 VITALS — BP 118/82 | HR 60 | Temp 98.3°F | Wt 193.0 lb

## 2020-07-01 DIAGNOSIS — J3489 Other specified disorders of nose and nasal sinuses: Secondary | ICD-10-CM

## 2020-07-01 DIAGNOSIS — F321 Major depressive disorder, single episode, moderate: Secondary | ICD-10-CM

## 2020-07-01 DIAGNOSIS — F411 Generalized anxiety disorder: Secondary | ICD-10-CM

## 2020-07-01 DIAGNOSIS — K2950 Unspecified chronic gastritis without bleeding: Secondary | ICD-10-CM

## 2020-07-01 MED ORDER — PAROXETINE HCL 30 MG PO TABS: 30.0000 mg | ORAL_TABLET | Freq: Every day | ORAL | 3 refills | Status: DC

## 2020-07-01 NOTE — Patient Instructions (Addendum)
Gastritis, Adult Gastritis is inflammation of the stomach. There are two kinds of gastritis:  Acute gastritis. This kind develops suddenly.  Chronic gastritis. This kind is much more common and lasts for a long time. Gastritis happens when the lining of the stomach becomes weak or gets damaged. Without treatment, gastritis can lead to stomach bleeding and ulcers. What are the causes? This condition may be caused by:  An infection.  Drinking too much alcohol.  Certain medicines. These include steroids, antibiotics, and some over-the-counter medicines, such as aspirin or ibuprofen.  Having too much acid in the stomach.  A disease of the intestines or stomach.  Stress.  An allergic reaction.  Crohn's disease.  Some cancer treatments (radiation). Sometimes the cause of this condition is not known. What are the signs or symptoms? Symptoms of this condition include:  Pain or a burning sensation in the upper abdomen.  Nausea.  Vomiting.  An uncomfortable feeling of fullness after eating.  Weight loss.  Bad breath.  Blood in your vomit or stools. In some cases, there are no symptoms. How is this diagnosed? This condition may be diagnosed with:  Your medical history and a description of your symptoms.  A physical exam.  Tests. These can include: ? Blood tests. ? Stool tests. ? A test in which a thin, flexible instrument with a light and a camera is passed down the esophagus and into the stomach (upper endoscopy). ? A test in which a sample of tissue is taken for testing (biopsy). How is this treated? This condition may be treated with medicines. The medicines that are used vary depending on the cause of the gastritis:  If the condition is caused by a bacterial infection, you may be given antibiotic medicines.  If the condition is caused by too much acid in the stomach, you may be given medicines called H2 blockers, proton pump inhibitors, or antacids. Treatment  may also involve stopping the use of certain medicines, such as aspirin, ibuprofen, or other NSAIDs. Follow these instructions at home: Medicines  Take over-the-counter and prescription medicines only as told by your health care provider.  If you were prescribed an antibiotic medicine, take it as told by your health care provider. Do not stop taking the antibiotic even if you start to feel better. Eating and drinking   Eat small, frequent meals instead of large meals.  Avoid foods and drinks that make your symptoms worse.  Drink enough fluid to keep your urine pale yellow. Alcohol use  Do not drink alcohol if: ? Your health care provider tells you not to drink. ? You are pregnant, may be pregnant, or are planning to become pregnant.  If you drink alcohol: ? Limit your use to:  0-1 drink a day for women.  0-2 drinks a day for men. ? Be aware of how much alcohol is in your drink. In the [place] one drink equals one 12 oz bottle of beer (355 mL), one 5 oz glass of wine (148 mL), or one 1 oz glass of hard liquor (44 mL). General instructions  Talk with your health care provider about ways to manage stress, such as getting regular exercise or practicing deep breathing, meditation, or yoga.  Do not use any products that contain nicotine or tobacco, such as cigarettes and e-cigarettes. If you need help quitting, ask your health care provider.  Keep all follow-up visits as told by your health care provider. This is important. Contact a health care provider if:  Your   symptoms get worse.  Your symptoms return after treatment. Get help right away if:  You vomit blood or material that looks like coffee grounds.  You have black or dark red stools.  You are unable to keep fluids down.  Your abdominal pain gets worse.  You have a fever.  You do not feel better after one week. Summary  Gastritis is inflammation of the lining of the stomach that can occur suddenly (acute) or  develop slowly over time (chronic).  This condition is diagnosed with a medical history, a physical exam, or tests.  This condition may be treated with medicines to treat infection or medicines to reduce the amount of acid in your stomach.  Follow your health care provider's instructions about taking medicines, making changes to your diet, and knowing when to call for help. This information is not intended to replace advice given to you by your health care provider. Make sure you discuss any questions you have with your health care provider. Document Revised: 02/06/2018 Document Reviewed: 02/06/2018 Elsevier Patient Education  2020 Elsevier Inc.  Managing Anxiety, Adult After being diagnosed with an anxiety disorder, you may be relieved to know why you have felt or behaved a certain way. You may also feel overwhelmed about the treatment ahead and what it will mean for your life. With care and support, you can manage this condition and recover from it. How to manage lifestyle changes Managing stress and anxiety  Stress is your body's reaction to life changes and events, both good and bad. Most stress will last just a few hours, but stress can be ongoing and can lead to more than just stress. Although stress can play a major role in anxiety, it is not the same as anxiety. Stress is usually caused by something external, such as a deadline, test, or competition. Stress normally passes after the triggering event has ended.  Anxiety is caused by something internal, such as imagining a terrible outcome or worrying that something will go wrong that will devastate you. Anxiety often does not go away even after the triggering event is over, and it can become long-term (chronic) worry. It is important to understand the differences between stress and anxiety and to manage your stress effectively so that it does not lead to an anxious response. Talk with your health care provider or a counselor to learn more  about reducing anxiety and stress. He or she may suggest tension reduction techniques, such as:  Music therapy. This can include creating or listening to music that you enjoy and that inspires you.  Mindfulness-based meditation. This involves being aware of your normal breaths while not trying to control your breathing. It can be done while sitting or walking.  Centering prayer. This involves focusing on a word, phrase, or sacred image that means something to you and brings you peace.  Deep breathing. To do this, expand your stomach and inhale slowly through your nose. Hold your breath for 3-5 seconds. Then exhale slowly, letting your stomach muscles relax.  Self-talk. This involves identifying thought patterns that lead to anxiety reactions and changing those patterns.  Muscle relaxation. This involves tensing muscles and then relaxing them. Choose a tension reduction technique that suits your lifestyle and personality. These techniques take time and practice. Set aside 5-15 minutes a day to do them. Therapists can offer counseling and training in these techniques. The training to help with anxiety may be covered by some insurance plans. Other things you can do to manage  stress and anxiety include:  Keeping a stress/anxiety diary. This can help you learn what triggers your reaction and then learn ways to manage your response.  Thinking about how you react to certain situations. You may not be able to control everything, but you can control your response.  Making time for activities that help you relax and not feeling guilty about spending your time in this way.  Visual imagery and yoga can help you stay calm and relax.  Medicines Medicines can help ease symptoms. Medicines for anxiety include:  Anti-anxiety drugs.  Antidepressants. Medicines are often used as a primary treatment for anxiety disorder. Medicines will be prescribed by a health care provider. When used together, medicines,  psychotherapy, and tension reduction techniques may be the most effective treatment. Relationships Relationships can play a big part in helping you recover. Try to spend more time connecting with trusted friends and family members. Consider going to couples counseling, taking family education classes, or going to family therapy. Therapy can help you and others better understand your condition. How to recognize changes in your anxiety Everyone responds differently to treatment for anxiety. Recovery from anxiety happens when symptoms decrease and stop interfering with your daily activities at home or work. This may mean that you will start to:  Have better concentration and focus. Worry will interfere less in your daily thinking.  Sleep better.  Be less irritable.  Have more energy.  Have improved memory. It is important to recognize when your condition is getting worse. Contact your health care provider if your symptoms interfere with home or work and you feel like your condition is not improving. Follow these instructions at home: Activity  Exercise. Most adults should do the following: ? Exercise for at least 150 minutes each week. The exercise should increase your heart rate and make you sweat (moderate-intensity exercise). ? Strengthening exercises at least twice a week.  Get the right amount and quality of sleep. Most adults need 7-9 hours of sleep each night. Lifestyle   Eat a healthy diet that includes plenty of vegetables, fruits, whole grains, low-fat dairy products, and lean protein. Do not eat a lot of foods that are high in solid fats, added sugars, or salt.  Make choices that simplify your life.  Do not use any products that contain nicotine or tobacco, such as cigarettes, e-cigarettes, and chewing tobacco. If you need help quitting, ask your health care provider.  Avoid caffeine, alcohol, and certain over-the-counter cold medicines. These may make you feel worse. Ask  your pharmacist which medicines to avoid. General instructions  Take over-the-counter and prescription medicines only as told by your health care provider.  Keep all follow-up visits as told by your health care provider. This is important. Where to find support You can get help and support from these sources:  Self-help groups.  Online and [employer].  A trusted spiritual leader.  Couples counseling.  Family education classes.  Family therapy. Where to find more information You may find that joining a support group helps you deal with your anxiety. The following sources can help you locate counselors or support groups near you:  Mental Health America: www.mentalhealthamerica.net  Anxiety and Depression Association of [place] (ADAA): ProgramCam.de  [employer] Mental Illness (NAMI): www.nami.org Contact a health care provider if you:  Have a hard time staying focused or finishing daily tasks.  Spend many hours a day feeling worried about everyday life.  Become exhausted by worry.  Start to have headaches,  feel tense, or have nausea.  Urinate more than normal.  Have diarrhea. Get help right away if you have:  A racing heart and shortness of breath.  Thoughts of hurting yourself or others. If you ever feel like you may hurt yourself or others, or have thoughts about taking your own life, get help right away. You can go to your nearest emergency department or call:  Your local emergency services (911 in the [place]  A suicide crisis helpline, such as the National Suicide Prevention Lifeline at 715-507-21941-971-287-4375. This is open 24 hours a day. Summary  Taking steps to learn and use tension reduction techniques can help calm you and help prevent triggering an anxiety reaction.  When used together, medicines, psychotherapy, and tension reduction techniques may be the most effective treatment.  Family, friends, and partners can play a big part in  helping you recover from an anxiety disorder. This information is not intended to replace advice given to you by your health care provider. Make sure you discuss any questions you have with your health care provider. Document Revised: 02/19/2019 Document Reviewed: 02/19/2019 Elsevier Patient Education  2020 Elsevier Inc.  Living With Depression Everyone experiences occasional disappointment, sadness, and loss in their lives. When you are feeling down, blue, or sad for at least 2 weeks in a row, it may mean that you have depression. Depression can affect your thoughts and feelings, relationships, daily activities, and physical health. It is caused by changes in the way your brain functions. If you receive a diagnosis of depression, your health care provider will tell you which type of depression you have and what treatment options are available to you. If you are living with depression, there are ways to help you recover from it and also ways to prevent it from coming back. How to cope with lifestyle changes Coping with stress     Stress is your body's reaction to life changes and events, both good and bad. Stressful situations may include:  Getting married.  The death of a spouse.  Losing a job.  Retiring.  Having a baby. Stress can last just a few hours or it can be ongoing. Stress can play a major role in depression, so it is important to learn both how to cope with stress and how to think about it differently. Talk with your health care provider or a counselor if you would like to learn more about stress reduction. He or she may suggest some stress reduction techniques, such as:  Music therapy. This can include creating music or listening to music. Choose music that you enjoy and that inspires you.  Mindfulness-based meditation. This kind of meditation can be done while sitting or walking. It involves being aware of your normal breaths, rather than trying to control your  breathing.  Centering prayer. This is a kind of meditation that involves focusing on a spiritual word or phrase. Choose a word, phrase, or sacred image that is meaningful to you and that brings you peace.  Deep breathing. To do this, expand your stomach and inhale slowly through your nose. Hold your breath for 3-5 seconds, then exhale slowly, allowing your stomach muscles to relax.  Muscle relaxation. This involves intentionally tensing muscles then relaxing them. Choose a stress reduction technique that fits your lifestyle and personality. Stress reduction techniques take time and practice to develop. Set aside 5-15 minutes a day to do them. Therapists can offer training in these techniques. The training may be covered by some insurance  plans. Other things you can do to manage stress include:  Keeping a stress diary. This can help you learn what triggers your stress and ways to control your response.  Understanding what your limits are and saying no to requests or events that lead to a schedule that is too full.  Thinking about how you respond to certain situations. You may not be able to control everything, but you can control how you react.  Adding humor to your life by watching funny films or TV shows.  Making time for activities that help you relax and not feeling guilty about spending your time this way.  Medicines Your health care provider may suggest certain medicines if he or she feels that they will help improve your condition. Avoid using alcohol and other substances that may prevent your medicines from working properly (may interact). It is also important to:  Talk with your pharmacist or health care provider about all the medicines that you take, their possible side effects, and what medicines are safe to take together.  Make it your goal to take part in all treatment decisions (shared decision-making). This includes giving input on the side effects of medicines. It is best if  shared decision-making with your health care provider is part of your total treatment plan. If your health care provider prescribes a medicine, you may not notice the full benefits of it for 4-8 weeks. Most people who are treated for depression need to be on medicine for at least 6-12 months after they feel better. If you are taking medicines as part of your treatment, do not stop taking medicines without first talking to your health care provider. You may need to have the medicine slowly decreased (tapered) over time to decrease the risk of harmful side effects. Relationships Your health care provider may suggest family therapy along with individual therapy and drug therapy. While there may not be family problems that are causing you to feel depressed, it is still important to make sure your family learns as much as they can about your mental health. Having your family's support can help make your treatment successful. How to recognize changes in your condition Everyone has a different response to treatment for depression. Recovery from major depression happens when you have not had signs of major depression for two months. This may mean that you will start to:  Have more interest in doing activities.  Feel less hopeless than you did 2 months ago.  Have more energy.  Overeat less often, or have better or improving appetite.  Have better concentration. Your health care provider will work with you to decide the next steps in your recovery. It is also important to recognize when your condition is getting worse. Watch for these signs:  Having fatigue or low energy.  Eating too much or too little.  Sleeping too much or too little.  Feeling restless, agitated, or hopeless.  Having trouble concentrating or making decisions.  Having unexplained physical complaints.  Feeling irritable, angry, or aggressive. Get help as soon as you or your family members notice these symptoms coming back. How  to get support and help from others How to talk with friends and family members about your condition  Talking to friends and family members about your condition can provide you with one way to get support and guidance. Reach out to trusted friends or family members, explain your symptoms to them, and let them know that you are working with a health care provider to  treat your depression. Financial resources Not all insurance plans cover mental health care, so it is important to check with your insurance carrier. If paying for co-pays or counseling services is a problem, search for a local or county mental health care center. They may be able to offer public mental health care services at low or no cost when you are not able to see a private health care provider. If you are taking medicine for depression, you may be able to get the generic form, which may be less expensive. Some makers of prescription medicines also offer help to patients who cannot afford the medicines they need. Follow these instructions at home:   Get the right amount and quality of sleep.  Cut down on using caffeine, tobacco, alcohol, and other potentially harmful substances.  Try to exercise, such as walking or lifting small weights.  Take over-the-counter and prescription medicines only as told by your health care provider.  Eat a healthy diet that includes plenty of vegetables, fruits, whole grains, low-fat dairy products, and lean protein. Do not eat a lot of foods that are high in solid fats, added sugars, or salt.  Keep all follow-up visits as told by your health care provider. This is important. Contact a health care provider if:  You stop taking your antidepressant medicines, and you have any of these symptoms: ? Nausea. ? Headache. ? Feeling lightheaded. ? Chills and body aches. ? Not being able to sleep (insomnia).  You or your friends and family think your depression is getting worse. Get help right away  if:  You have thoughts of hurting yourself or others. If you ever feel like you may hurt yourself or others, or have thoughts about taking your own life, get help right away. You can go to your nearest emergency department or call:  Your local emergency services (911 in the [place]  A suicide crisis helpline, such as the National Suicide Prevention Lifeline at (951)284-4321. This is open 24-hours a day. Summary  If you are living with depression, there are ways to help you recover from it and also ways to prevent it from coming back.  Work with your health care team to create a management plan that includes counseling, stress management techniques, and healthy lifestyle habits. This information is not intended to replace advice given to you by your health care provider. Make sure you discuss any questions you have with your health care provider. Document Revised: 01/11/2019 Document Reviewed: 08/22/2016 Elsevier Patient Education  2020 [employer].

## 2020-07-01 NOTE — Progress Notes (Signed)
Subjective:    Patient ID: Billy Garcia, male    DOB: 1991-06-01, 29 y.o.   MRN: [id number]  No chief complaint on file.   HPI Patient was seen today for f/u.  Pt still having some anxiety.  Taking Paxil 30 mg daily.  Pt endorses some frustration with friends and family as they feel he should be doing more with his life.  Pt writing music to expresses his feelings.  Pt was seen Billy Garcia for counseling, however had to stop 2/2 cost.  Pt still having pain in stomach with eating.  Notes it more with spicy foods.  Pt did notice some improvement when taking prilosec BID, but would forget to take it twice a day.  Pt mentions he may be allergic to cats.  Pt states he developed rhinorrhea, HA, scratchy throat on Sunday after visiting friends who have a cat.  Pt notes the strong odor from the cat's litter box seemed to cause symptoms.  Patient taking oxymetazoline nasal spray.  Not taking Zyrtec daily.  Past Medical History:  Diagnosis Date  . Perianal abscess, right posterior s/o I&D 10/18/2013 10/18/2013  . 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 +LUQ pain with eating GU: Denies dysuria, hematuria, frequency Msk: Denies muscle cramps, joint pains Neuro: Denies weakness, numbness, tingling Skin: Denies rashes, bruising Psych: Denies hallucinations  +anxiety, depression    Objective:    Blood pressure 118/82, pulse 60, temperature 98.3 F (36.8 C), temperature source Oral, weight 193 lb (87.5 kg), SpO2 97 %.  Gen. Pleasant, well-nourished, in no distress, normal affect   HEENT: [place]/AT, face symmetric,  conjunctiva clear, no scleral icterus, PERRLA, EOMI, nares patent without drainage Lungs: no accessory muscle use, CTAB, no wheezes or rales Cardiovascular: RRR, no peripheral edema Musculoskeletal: No deformities, no cyanosis or clubbing, normal tone Neuro:  A&Ox3, CN II-XII intact, normal gait Skin:  Warm, no lesions/ rash   Wt Readings from Last 3 Encounters:  07/01/20 193 lb (87.5 kg)  05/07/20 189 lb 9.6 oz (86 kg)  01/08/20 175 lb (79.4 kg)    Lab Results  Component Value Date   WBC 3.9 05/07/2020   HGB 14.3 05/07/2020   HCT 41.7 05/07/2020   PLT 169 05/07/2020   GLUCOSE 87 05/07/2020   ALT 10 05/07/2020   AST 19 05/07/2020   NA 140 05/07/2020   K 3.7 05/07/2020   CL 103 05/07/2020   CREATININE 0.97 05/07/2020   BUN 10 05/07/2020   CO2 31 05/07/2020   TSH 0.80 05/07/2020    Assessment/Plan:  GAD (generalized anxiety disorder) -Improving -GAD-7 score 12 this visit.  Was 22 on 05/07/2020 -Patient to look into counseling options.  Previously seen by Billy Garcia, however cost was an issue -Continue self-care and mindfulness -Given handout -Continue Paxil 30 mg daily  - Plan: PARoxetine (PAXIL) 30 MG tablet  Depression, major, single episode, moderate (HCC)  -PHQ-9 score 13 next visit.  Was 18 on 05/07/20 - Plan: PARoxetine (PAXIL) 30 MG tablet  Chronic gastritis, presence of bleeding unspecified, unspecified gastritis type  -Discussed possible causes including gastric ulcer -Continue Prilosec twice daily -Avoid foods known to cause problems - Plan: Ambulatory referral to  Gastroenterology  Rhinorrhea -discussed symptoms possibly 2/2 allergies to cats/cat dander, however must consider viral causes including COVID-19 -Patient advised to restart Zyrtec along with oxymetazoline nasal spray.  -If no improvement in symptoms noted within the next week day with medication obtain Covid testing  F/u prn in 1 month  Abbe [place], MD

## 2020-07-14 ENCOUNTER — Ambulatory Visit: Payer: BC Managed Care – PPO | Admitting: Psychology

## 2020-07-24 ENCOUNTER — Other Ambulatory Visit: Payer: Self-pay | Admitting: Family Medicine

## 2020-08-12 ENCOUNTER — Ambulatory Visit: Payer: BC Managed Care – PPO | Admitting: Family Medicine

## 2020-08-12 ENCOUNTER — Encounter: Payer: Self-pay | Admitting: Family Medicine

## 2020-08-12 ENCOUNTER — Other Ambulatory Visit: Payer: Self-pay

## 2020-08-12 VITALS — BP 116/72 | HR 57 | Temp 98.7°F | Wt 204.2 lb

## 2020-08-12 DIAGNOSIS — F411 Generalized anxiety disorder: Secondary | ICD-10-CM

## 2020-08-12 DIAGNOSIS — Z113 Encounter for screening for infections with a predominantly sexual mode of transmission: Secondary | ICD-10-CM

## 2020-08-12 DIAGNOSIS — F321 Major depressive disorder, single episode, moderate: Secondary | ICD-10-CM

## 2020-08-12 MED ORDER — PAROXETINE HCL 30 MG PO TABS: 30.0000 mg | ORAL_TABLET | Freq: Every day | ORAL | 0 refills | Status: DC

## 2020-08-12 NOTE — Addendum Note (Signed)
Addended by: Lerry Liner on: 08/12/2020 11:35 AM   Modules accepted: Orders

## 2020-08-12 NOTE — Patient Instructions (Signed)
Generalized Anxiety Disorder, Adult Generalized anxiety disorder (GAD) is a mental health disorder. People with this condition constantly worry about everyday events. Unlike normal anxiety, worry related to GAD is not triggered by a specific event. These worries also do not fade or get better with time. GAD interferes with life functions, including relationships, work, and school. GAD can vary from mild to severe. People with severe GAD can have intense waves of anxiety with physical symptoms (panic attacks). What are the causes? The exact cause of GAD is not known. What increases the risk? This condition is more likely to develop in:  Women.  People who have a family history of anxiety disorders.  People who are very shy.  People who experience very stressful life events, such as the death of a loved one.  People who have a very stressful family environment. What are the signs or symptoms? People with GAD often worry excessively about many things in their lives, such as their health and family. They may also be overly concerned about:  Doing well at work.  Being on time.  Natural disasters.  Friendships. Physical symptoms of GAD include:  Fatigue.  Muscle tension or having muscle twitches.  Trembling or feeling shaky.  Being easily startled.  Feeling like your heart is pounding or racing.  Feeling out of breath or like you cannot take a deep breath.  Having trouble falling asleep or staying asleep.  Sweating.  Nausea, diarrhea, or irritable bowel syndrome (IBS).  Headaches.  Trouble concentrating or remembering facts.  Restlessness.  Irritability. How is this diagnosed? Your health care provider can diagnose GAD based on your symptoms and medical history. You will also have a physical exam. The health care provider will ask specific questions about your symptoms, including how severe they are, when they started, and if they come and go. Your health care  provider may ask you about your use of alcohol or drugs, including prescription medicines. Your health care provider may refer you to a mental health specialist for further evaluation. Your health care provider will do a thorough examination and may perform additional tests to rule out other possible causes of your symptoms. To be diagnosed with GAD, a person must have anxiety that:  Is out of his or her control.  Affects several different aspects of his or her life, such as work and relationships.  Causes distress that makes him or her unable to take part in normal activities.  Includes at least three physical symptoms of GAD, such as restlessness, fatigue, trouble concentrating, irritability, muscle tension, or sleep problems. Before your health care provider can confirm a diagnosis of GAD, these symptoms must be present more days than they are not, and they must last for six months or longer. How is this treated? The following therapies are usually used to treat GAD:  Medicine. Antidepressant medicine is usually prescribed for long-term daily control. Antianxiety medicines may be added in severe cases, especially when panic attacks occur.  Talk therapy (psychotherapy). Certain types of talk therapy can be helpful in treating GAD by providing support, education, and guidance. Options include: ? Cognitive behavioral therapy (CBT). People learn coping skills and techniques to ease their anxiety. They learn to identify unrealistic or negative thoughts and behaviors and to replace them with positive ones. ? Acceptance and commitment therapy (ACT). This treatment teaches people how to be mindful as a way to cope with unwanted thoughts and feelings. ? Biofeedback. This process trains you to manage your body's response (  physiological response) through breathing techniques and relaxation methods. You will work with a therapist while machines are used to monitor your physical symptoms.  Stress  management techniques. These include yoga, meditation, and exercise. A mental health specialist can help determine which treatment is best for you. Some people see improvement with one type of therapy. However, other people require a combination of therapies. Follow these instructions at home:  Take over-the-counter and prescription medicines only as told by your health care provider.  Try to maintain a normal routine.  Try to anticipate stressful situations and allow extra time to manage them.  Practice any stress management or [place]-calming techniques as taught by your health care provider.  Do not punish yourself for setbacks or for not making progress.  Try to recognize your accomplishments, even if they are small.  Keep all follow-up visits as told by your health care provider. This is important. Contact a health care provider if:  Your symptoms do not get better.  Your symptoms get worse.  You have signs of depression, such as: ? A persistently sad, cranky, or irritable mood. ? Loss of enjoyment in activities that used to bring you joy. ? Change in weight or eating. ? Changes in sleeping habits. ? Avoiding friends or family members. ? Loss of energy for normal tasks. ? Feelings of guilt or worthlessness. Get help right away if:  You have serious thoughts about hurting yourself or others. If you ever feel like you may hurt yourself or others, or have thoughts about taking your own life, get help right away. You can go to your nearest emergency department or call:  Your local emergency services (911 in the [place]  A suicide crisis helpline, such as the [facility] at (380) 541-4062. This is open 24 hours a day. Summary  Generalized anxiety disorder (GAD) is a mental health disorder that involves worry that is not triggered by a specific event.  People with GAD often worry excessively about many things in their lives, such as their health and  family.  GAD may cause physical symptoms such as restlessness, trouble concentrating, sleep problems, frequent sweating, nausea, diarrhea, headaches, and trembling or muscle twitching.  A mental health specialist can help determine which treatment is best for you. Some people see improvement with one type of therapy. However, other people require a combination of therapies. This information is not intended to replace advice given to you by your health care provider. Make sure you discuss any questions you have with your health care provider. Document Revised: 09/01/2017 Document Reviewed: 08/09/2016 Elsevier Patient Education  [place], Adult After being diagnosed with an anxiety disorder, you may be relieved to know why you have felt or behaved a certain way. You may also feel overwhelmed about the treatment ahead and what it will mean for your life. With care and support, you can manage this condition and recover from it. How to manage lifestyle changes Managing stress and anxiety  Stress is your body's reaction to life changes and events, both good and bad. Most stress will last just a few hours, but stress can be ongoing and can lead to more than just stress. Although stress can play a major role in anxiety, it is not the same as anxiety. Stress is usually caused by something external, such as a deadline, test, or competition. Stress normally passes after the triggering event has ended.  Anxiety is caused by something internal, such as imagining a terrible  outcome or worrying that something will go wrong that will devastate you. Anxiety often does not go away even after the triggering event is over, and it can become long-term (chronic) worry. It is important to understand the differences between stress and anxiety and to manage your stress effectively so that it does not lead to an anxious response. Talk with your health care provider or a counselor to learn more  about reducing anxiety and stress. He or she may suggest tension reduction techniques, such as:  Music therapy. This can include creating or listening to music that you enjoy and that inspires you.  Mindfulness-based meditation. This involves being aware of your normal breaths while not trying to control your breathing. It can be done while sitting or walking.  Centering prayer. This involves focusing on a word, phrase, or sacred image that means something to you and brings you peace.  Deep breathing. To do this, expand your stomach and inhale slowly through your nose. Hold your breath for 3-5 seconds. Then exhale slowly, letting your stomach muscles relax.  Self-talk. This involves identifying thought patterns that lead to anxiety reactions and changing those patterns.  Muscle relaxation. This involves tensing muscles and then relaxing them. Choose a tension reduction technique that suits your lifestyle and personality. These techniques take time and practice. Set aside 5-15 minutes a day to do them. Therapists can offer counseling and training in these techniques. The training to help with anxiety may be covered by some insurance plans. Other things you can do to manage stress and anxiety include:  Keeping a stress/anxiety diary. This can help you learn what triggers your reaction and then learn ways to manage your response.  Thinking about how you react to certain situations. You may not be able to control everything, but you can control your response.  Making time for activities that help you relax and not feeling guilty about spending your time in this way.  Visual imagery and yoga can help you stay calm and relax.  Medicines Medicines can help ease symptoms. Medicines for anxiety include:  Anti-anxiety drugs.  Antidepressants. Medicines are often used as a primary treatment for anxiety disorder. Medicines will be prescribed by a health care provider. When used together, medicines,  psychotherapy, and tension reduction techniques may be the most effective treatment. Relationships Relationships can play a big part in helping you recover. Try to spend more time connecting with trusted friends and family members. Consider going to couples counseling, taking family education classes, or going to family therapy. Therapy can help you and others better understand your condition. How to recognize changes in your anxiety Everyone responds differently to treatment for anxiety. Recovery from anxiety happens when symptoms decrease and stop interfering with your daily activities at home or work. This may mean that you will start to:  Have better concentration and focus. Worry will interfere less in your daily thinking.  Sleep better.  Be less irritable.  Have more energy.  Have improved memory. It is important to recognize when your condition is getting worse. Contact your health care provider if your symptoms interfere with home or work and you feel like your condition is not improving. Follow these instructions at home: Activity  Exercise. Most adults should do the following: ? Exercise for at least 150 minutes each week. The exercise should increase your heart rate and make you sweat (moderate-intensity exercise). ? Strengthening exercises at least twice a week.  Get the right amount and quality of sleep. Most  adults need 7-9 hours of sleep each night. Lifestyle   Eat a healthy diet that includes plenty of vegetables, fruits, whole grains, low-fat dairy products, and lean protein. Do not eat a lot of foods that are high in solid fats, added sugars, or salt.  Make choices that simplify your life.  Do not use any products that contain nicotine or tobacco, such as cigarettes, e-cigarettes, and chewing tobacco. If you need help quitting, ask your health care provider.  Avoid caffeine, alcohol, and certain over-the-counter cold medicines. These may make you feel worse. Ask  your pharmacist which medicines to avoid. General instructions  Take over-the-counter and prescription medicines only as told by your health care provider.  Keep all follow-up visits as told by your health care provider. This is important. Where to find support You can get help and support from these sources:  Self-help groups.  Online and [employer].  A trusted spiritual leader.  Couples counseling.  Family education classes.  Family therapy. Where to find more information You may find that joining a support group helps you deal with your anxiety. The following sources can help you locate counselors or support groups near you:  Mental Health America: www.mentalhealthamerica.net  Anxiety and Depression Association of [place] (ADAA): ProgramCam.de  [employer] on Mental Illness (NAMI): www.nami.org Contact a health care provider if you:  Have a hard time staying focused or finishing daily tasks.  Spend many hours a day feeling worried about everyday life.  Become exhausted by worry.  Start to have headaches, feel tense, or have nausea.  Urinate more than normal.  Have diarrhea. Get help right away if you have:  A racing heart and shortness of breath.  Thoughts of hurting yourself or others. If you ever feel like you may hurt yourself or others, or have thoughts about taking your own life, get help right away. You can go to your nearest emergency department or call:  Your local emergency services (911 in the [place]  A suicide crisis helpline, such as the National Suicide Prevention Lifeline at 660-514-2881. This is open 24 hours a day. Summary  Taking steps to learn and use tension reduction techniques can help calm you and help prevent triggering an anxiety reaction.  When used together, medicines, psychotherapy, and tension reduction techniques may be the most effective treatment.  Family, friends, and partners can play a big part in  helping you recover from an anxiety disorder. This information is not intended to replace advice given to you by your health care provider. Make sure you discuss any questions you have with your health care provider. Document Revised: 02/19/2019 Document Reviewed: 02/19/2019 Elsevier Patient Education  2020 [employer].  Safe Sex Practicing safe sex means taking steps before and during sex to reduce your risk of:  Getting an STI (sexually transmitted infection).  Giving your partner an STI.  Unwanted or unplanned pregnancy. How can I practice safe sex?     Ways you can practice safe sex  Limit your sexual partners to only one partner who is having sex with only you.  Avoid using alcohol and drugs before having sex. Alcohol and drugs can affect your judgment.  Before having sex with a new partner: ? Talk to your partner about past partners, past STIs, and drug use. ? Get screened for STIs and discuss the results with your partner. Ask your partner to get screened, too.  Check your body regularly for sores, blisters, rashes, or unusual discharge. If you notice  any of these problems, visit your health care provider.  Avoid sexual contact if you have symptoms of an infection or you are being treated for an STI.  While having sex, use a condom. Make sure to: ? Use a condom every time you have vaginal, oral, or anal sex. Both females and males should wear condoms during oral sex. ? Keep condoms in place from the beginning to the end of sexual activity. ? Use a latex condom, if possible. Latex condoms offer the best protection. ? Use only water-based lubricants with a condom. Using petroleum-based lubricants or oils will weaken the condom and increase the chance that it will break. Ways your health care provider can help you practice safe sex  See your health care provider for regular screenings, exams, and tests for STIs.  Talk with your health care provider about what kind of  birth control (contraception) is best for you.  Get vaccinated against hepatitis B and human papillomavirus (HPV).  If you are at risk of being infected with HIV (human immunodeficiency virus), talk with your health care provider about taking a prescription medicine to prevent HIV infection. You are at risk for HIV if you: ? Are a man who has sex with other men. ? Are sexually active with more than one partner. ? Take drugs by injection. ? Have a sex partner who has HIV. ? Have unprotected sex. ? Have sex with someone who has sex with both men and women. ? Have had an STI. Follow these instructions at home:  Take over-the-counter and prescription medicines as told by your health care provider.  Keep all follow-up visits as told by your health care provider. This is important. Where to find more information  Centers for Disease Control and Prevention: LessFurniture.be  Planned Parenthood: https://www.plannedparenthood.org/  Office on Women's Health: EmploymentTracking.tn Summary  Practicing safe sex means taking steps before and during sex to reduce your risk of STIs, giving your partner STIs, and having an unwanted or unplanned pregnancy.  Before having sex with a new partner, talk to your partner about past partners, past STIs, and drug use.  Use a condom every time you have vaginal, oral, or anal sex. Both females and males should wear condoms during oral sex.  Check your body regularly for sores, blisters, rashes, or unusual discharge. If you notice any of these problems, visit your health care provider.  See your health care provider for regular screenings, exams, and tests for STIs. This information is not intended to replace advice given to you by your health care provider. Make sure you discuss any questions you have with your health care provider. Document Revised: 01/11/2019 Document Reviewed:  07/02/2018 Elsevier Patient Education  2020 [employer].

## 2020-08-12 NOTE — Progress Notes (Signed)
Subjective:    Patient ID: Billy Garcia, male    DOB: 1991-03-13, 29 y.o.   MRN: [id number]  No chief complaint on file.   HPI Male with past medical history significant for anxiety, depression, GERD who was seen for follow-up.  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  Patient states he is no longer working at [employer].  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  Patient requesting refill on Paxil 30 mg as he does not want to be without medication.  Patient notes some increased stress in his personal life regards to relationships.  Patient inquires about STI testing as he had recent unprotected sex.  Patient denies discharge, dysuria, rash, or other lesions.  Patient states he is trying to get himself together so he will not be a "broke friend"  Past Medical History:  Diagnosis Date  . Perianal abscess, right posterior s/o I&D 10/18/2013 10/18/2013  . 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 12/04/2013    No Known Allergies  ROS General: Denies fever, chills, night sweats, changes in weight, changes in appetite HEENT: Denies headaches, ear pain, changes in vision, rhinorrhea, sore throat CV: Denies CP, palpitations, SOB, orthopnea Pulm: Denies SOB, cough, wheezing GI: Denies abdominal pain, nausea, vomiting, diarrhea, constipation GU: Denies dysuria, hematuria, frequency Msk: Denies muscle cramps, joint pains  Neuro: Denies weakness, numbness, tingling Skin: Denies rashes, bruising Psych: Denies hallucinations  + anxiety, depression     Objective:    Blood pressure 116/72, pulse (!) 57, temperature 98.7 F (37.1 C), temperature source Oral, weight 204 lb 3.2 oz (92.6 kg), SpO2 97 %.   Gen. Pleasant, well-nourished, in no distress, normal affect   HEENT: [place]/AT, face symmetric, conjunctiva clear, no scleral  icterus, PERRLA, EOMI, nares patent without drainage Lungs: no accessory muscle use Cardiovascular: RRR, no peripheral edema Musculoskeletal: No deformities, no cyanosis or clubbing, normal tone Neuro:  A&Ox3, CN II-XII intact, normal gait Skin:  Warm, no lesions/ rash   Wt Readings from Last 3 Encounters:  07/01/20 193 lb (87.5 kg)  05/07/20 189 lb 9.6 oz (86 kg)  01/08/20 175 lb (79.4 kg)    Lab Results  Component Value Date   WBC 3.9 05/07/2020   HGB 14.3 05/07/2020   HCT 41.7 05/07/2020   PLT 169 05/07/2020   GLUCOSE 87 05/07/2020   ALT 10 05/07/2020   AST 19 05/07/2020   NA 140 05/07/2020   K 3.7 05/07/2020   CL 103 05/07/2020   CREATININE 0.97 05/07/2020   BUN 10 05/07/2020   CO2 31 05/07/2020   TSH 0.80 05/07/2020    Assessment/Plan:  GAD (generalized anxiety disorder)  -GAD-7 score of 11 visit.  Was 13 on 07/01/2020 -Discussed ways to reduce anxiety -Self care encouraged -Patient encouraged to make decisions regarding his relationship. -Consider restarting counseling.  Stopped 2/2 cost -Continue Paxil 30 mg daily.  Given a 68-month supply while patient waiting for new insurance to start -Given handout - Plan: PARoxetine (PAXIL) 30 MG tablet  Routine screening for STI (sexually transmitted infection) -Discussed safe sex practices -Patient encouraged to make decisions regarding relationships -Offered HIV and RPR testing.  Patient declines at this time. - Plan: C. trachomatis/N. gonorrhoeae RNA  Depression, major, single episode, moderate (HCC)  -PHQ-9 score 13.  Was 12 point 07/01/2020 -Self care -Continue restarting counseling -Continue Paxil 30  mg daily -Patient to follow-up in 4-6 weeks, sooner if needed - Plan: PARoxetine (PAXIL) 30 MG tablet  F/u in 1 month, sooner if needed  Abbe Amsterdam, MD

## 2020-08-13 LAB — C. TRACHOMATIS/N. GONORRHOEAE RNA
C. trachomatis RNA, TMA: NOT DETECTED
N. gonorrhoeae RNA, TMA: NOT DETECTED

## 2020-09-08 ENCOUNTER — Ambulatory Visit: Payer: BC Managed Care – PPO | Admitting: Psychology

## 2020-09-15 ENCOUNTER — Ambulatory Visit: Payer: BC Managed Care – PPO | Admitting: Psychology

## 2020-10-06 ENCOUNTER — Other Ambulatory Visit: Payer: Self-pay | Admitting: Family Medicine

## 2020-10-06 DIAGNOSIS — F321 Major depressive disorder, single episode, moderate: Secondary | ICD-10-CM

## 2020-10-06 DIAGNOSIS — F411 Generalized anxiety disorder: Secondary | ICD-10-CM

## 2020-10-06 NOTE — Telephone Encounter (Signed)
Pt is calling in stating that he needs a refill on Rx omeprazole (PRILOSEC) 40 MG and PARoxetine (PAXIL) 30 MG and out of the medication (was waiting on new insurance to become active).      Pharm:  [facility] [address], [place], [place]

## 2020-10-07 MED ORDER — OMEPRAZOLE 40 MG PO CPDR: DELAYED_RELEASE_CAPSULE | ORAL | 0 refills | Status: AC

## 2020-10-07 NOTE — Telephone Encounter (Signed)
Spoke with pt pharmacy state that pt still has enough refills for this Rx, no refills needed at this time

## 2020-10-28 ENCOUNTER — Other Ambulatory Visit: Payer: Self-pay

## 2020-10-29 ENCOUNTER — Encounter: Payer: Self-pay | Admitting: Family Medicine

## 2020-10-29 ENCOUNTER — Ambulatory Visit: Payer: 59 | Admitting: Family Medicine

## 2020-10-29 VITALS — BP 118/78 | HR 69 | Temp 98.5°F | Ht 75.0 in | Wt 219.0 lb

## 2020-10-29 DIAGNOSIS — K2921 Alcoholic gastritis with bleeding: Secondary | ICD-10-CM

## 2020-10-29 DIAGNOSIS — F411 Generalized anxiety disorder: Secondary | ICD-10-CM

## 2020-10-29 DIAGNOSIS — F330 Major depressive disorder, recurrent, mild: Secondary | ICD-10-CM | POA: Diagnosis not present

## 2020-10-29 NOTE — Patient Instructions (Addendum)
https://www.ncbi.nlm.nih.gov/books/NBK537291/#:~:text=Interventional%20Radiology-,Deterrence%20and%20Patient%20Education,also%20known%20as%20the%20colon)."> https://www.ncbi.nlm.nih.gov/books/NBK470300/">  °Upper Gastrointestinal Bleeding ° °Upper gastrointestinal (GI) bleeding is bleeding from the esophagus, the stomach, or the first part of the small intestine (duodenum). The esophagus is the part of the body that moves food from the mouth to the stomach. Upper GI bleeding may cause a person to vomit blood or have bloody or black stools (feces). °Bleeding can range from mild to serious or even life-threatening. Continued or heavy bleeding may need emergency treatment at a hospital. °What are the causes? °This condition may be caused by: °· A sore in the lining of your stomach or duodenum (peptic ulcer). This is the most common cause of GI bleeding. °· Esophagitis, or inflammation of your esophagus. °· A tear in the esophagus. °· Cancer of the esophagus, stomach, or duodenum. °· An abnormal or weakened blood vessel in one of your upper GI structures. °· A disorder that makes it hard to form blood clots and causes easy bleeding (coagulopathy). °What increases the risk? °The following factors may make you more likely to develop this condition: °· Being older than age 60. °· Being male. °· Having certain medical conditions. These may include: °? Liver or kidney disease. °? Stomach infection caused by Helicobacter pylori bacteria. °· Having frequent or severe vomiting. °· Heavy use of alcohol. °· Regular use of aspirin or NSAIDs, such as ibuprofen. °· Taking blood thinners (anticoagulants) or anti-platelet medicines. °What are the signs or symptoms? °Symptoms of this condition include: °· Vomiting blood that may be bright red or the color of coffee grounds. °· Black or maroon-colored stools, or bloody stools. °· Racing heartbeat, weakness, or dizziness. °· Heartburn or pain in your abdomen. °· Trouble  swallowing. °· Weight loss. °· Skin or the white parts of your eyes turning yellow (jaundice). °How is this diagnosed? °This condition may be diagnosed based on: °· Your symptoms and medical history. °· A physical exam. During the exam, your health care provider will check for signs of blood loss, such as low blood pressure and a fast pulse. °· Tests or procedures. These may include: °? Blood tests to check for low blood cell count and other signs of blood loss, and to check clotting ability. °? Blood tests to check your liver and kidney function. °? A chest X-ray to look for a tear in the esophagus. °? Endoscopy. In this procedure, a flexible scope is put down your esophagus and into your stomach or duodenum to look for the source of bleeding. °? Angiogram. This may be done if the source of bleeding is not found during endoscopy. For an angiogram, X-rays are taken after a dye is injected into your bloodstream. °? Nasogastric tube insertion. This is a tube passed through your nose and down into your stomach. It may be connected to a source of gentle suction to see if any blood comes out. °How is this treated? °Treatment for this condition depends on the cause of your bleeding. Active bleeding is treated at the hospital. Treatment may include: °· Getting fluids through an IV inserted into one of your veins. °· Getting blood through an IV (blood transfusion). °· Getting high doses of medicine through the IV to lower stomach acid. This may be done to treat ulcer disease. °· Having endoscopy to treat an area of bleeding with high heat (coagulation), injections, or surgical clips. °· Having a procedure that involves first doing an angiogram and then blocking blood flow to the bleeding site (embolization). °· Stopping or changing some of your regular   medicines for a certain amount of time.  Having other surgical procedures if initial treatments do not control bleeding. Follow these instructions at home: Eating and  drinking  Follow instructions from your health care provider about eating or drinking restrictions.  Try to eat foods that have a lot of iron. These may include red meat, shellfish, chicken, eggs, and vegetables such as spinach or broccoli.  Drink enough fluid to keep your urine pale yellow.  Do not drink alcohol.   General instructions  Do not use any products that contain nicotine or tobacco, such as cigarettes, e-cigarettes, and chewing tobacco. If you need help quitting, ask your health care provider.  Take over-the-counter and prescription medicines only as told by your health care provider. You may need to avoid aspirin, NSAIDs, or other medicines that increase bleeding.  Return to your normal activities as told by your health care provider. Ask your health care provider what activities are safe for you.  Keep all follow-up visits as told by your health care provider. This is important.   Contact a health care provider if:  You have heartburn or pain in your abdomen.  You lose weight without trying.  You have trouble swallowing.  You vomit often.  You feel weak or dizzy.  You need help to stop smoking or drinking alcohol. Get help right away if:  You vomit blood.  You faint.  You have blood in your stools.  You develop jaundice.  You have severe cramps in your back or abdomen.  Your symptoms of upper GI bleeding come back after treatment. These symptoms may represent a serious problem that is an emergency. Do not wait to see if the symptoms will go away. Get medical help right away. Call your local emergency services (911 in the [place] Do not drive yourself to the hospital. Summary  If you have upper gastrointestinal (GI) bleeding, you may vomit blood or have bloody or black stools (feces).  Follow instructions from your health care provider about eating or drinking restrictions.  Do not drink alcohol. Do not use any products that contain nicotine or tobacco,  such as cigarettes, e-cigarettes, and chewing tobacco.  Take over-the-counter and prescription medicines only as told by your health care provider. You may need to avoid NSAIDs or other medicines that increase bleeding. This information is not intended to replace advice given to you by your health care provider. Make sure you discuss any questions you have with your health care provider. Document Revised: 09/14/2019 Document Reviewed: 09/14/2019 Elsevier Patient Education  2021 Elsevier Inc.  Peptic Ulcer Eating Plan Peptic ulcers are sores that form on the lining of the stomach, esophagus, or the part of the small intestine that is attached to the stomach (duodenum). These sores are also called stomach ulcers. When ulcers develop, they can cause a burning feeling in the stomach as well as bloating, nausea, vomiting, and poor appetite. If you have a history of peptic ulcers, it is important to keep track of what foods and drinks cause symptoms. What are tips for following this plan?  Eat a healthy, well-balanced diet. This includes: ? Fresh fruits and vegetables. Eat a variety of colors of fruits and vegetables. ? Whole grains. Try to make sure at least half of the grains you eat each day are whole grains. ? Low-fat dairy. ? Lean meat, fish, poultry, eggs, beans, and nuts. ? Healthy fats, such as olive oil, grapeseed oil, or canola oil. Try to eat less than 8 teaspoons  of fats and oils each day.  Avoid foods that cause irritation or pain. These may be different for different people. Keep a food diary to identify foods that cause symptoms.  Avoid processed foods that have added salt and sugar.  Avoid drinking alcohol.  Avoid drinks with caffeine, such as cola, black tea, energy drinks, and coffee.   Recommended foods Grains  Whole grains. Vegetables  All fresh or frozen vegetables. Low-sodium canned vegetables. Fruits  All fresh, frozen, or dried fruit. Fruit canned in  juice. Meats and other protein foods  Lean cuts of meat. Skinless poultry. Fresh or canned fish. Eggs. Tofu. Nuts and nut butter. Dried beans. Low-sodium canned beans. Dairy  Low-fat or nonfat (skim) milk. Nonfat or low-fat yogurt. Nonfat or low-fat cheese. Beverages  Water. Soy or nut milks. Caffeine-free soft drinks. Herbal tea. Fats and oils  Olive oil. Canola oil. Grapeseed oil. Sunflower oil. Seasoning and other foods  Low-fat salad dressing. Ketchup. Low-fat mayonnaise. All spices except pepper. Low-sodium seasoning mixes. Foods to avoid Meats and other protein foods  Fatty meats. Fried meats. Any meat that causes symptoms. Dairy  Whole milk. Ice cream. Cream. Chocolate milk. Beverages  Alcohol. Coffee. Cola and energy drinks. Black or green tea. Cocoa. Fats and oils  Butter. Lard. Ghee. Seasoning and other foods  Pepper. Hot sauce. Any seasonings or condiments that cause symptoms. Summary  Peptic ulcers can cause burning in the stomach as well as bloating, nausea, vomiting, and poor appetite. You may be able to limit symptoms by avoiding foods that make you feel worse.  Work with your dietitian or health care provider to identify foods that cause symptoms. This may include caffeinated drinks, alcohol, or pepper. This information is not intended to replace advice given to you by your health care provider. Make sure you discuss any questions you have with your health care provider. Document Revised: 09/01/2017 Document Reviewed: 10/31/2016 Elsevier Patient Education  2021 Elsevier Inc.  Peptic Ulcer  A peptic ulcer is a painful sore in the lining of your stomach or the first part of your small intestine. What are the causes? Common causes of this condition include:  An infection.  Using certain pain medicines too often or too much. What increases the risk? You are more likely to get this condition if you:  Smoke.  Have a family history of ulcer  disease.  Drink alcohol.  Have been hospitalized in an intensive care unit (ICU). What are the signs or symptoms? Symptoms include:  Burning pain in the area between the chest and the belly button. The pain may: ? Not go away (be persistent). ? Be worse when your stomach is empty. ? Be worse at night.  Heartburn.  Feeling sick to your stomach (nauseous) and throwing up (vomiting).  Bloating. If the ulcer results in bleeding, it can cause you to:  Have poop (stool) that is black and looks like tar.  Throw up bright red blood.  Throw up material that looks like coffee grounds. How is this treated? Treatment for this condition may include:  Stopping things that can cause the ulcer, such as: ? Smoking. ? Using pain medicines.  Medicines to reduce stomach acid.  Antibiotic medicines if the ulcer is caused by an infection.  A procedure that is done using a small, flexible tube that has a camera at the end (upper endoscopy). This may be done if you have a bleeding ulcer.  Surgery. This may be needed if: ? You have a  lot of bleeding. ? The ulcer caused a hole somewhere in the digestive system. Follow these instructions at home:  Do not drink alcohol if your doctor tells you not to drink.  Limit how much caffeine you take in.  Do not use any products that contain nicotine or tobacco, such as cigarettes, e-cigarettes, and chewing tobacco. If you need help quitting, ask your doctor.  Take over-the-counter and prescription medicines only as told by your doctor. ? Do not stop or change your medicines unless you talk with your doctor about it first. ? Do not take aspirin, ibuprofen, or other NSAIDs unless your doctor told you to do so.  Keep all follow-up visits as told by your doctor. This is important. Contact a doctor if:  You do not get better in 7 days after you start treatment.  You keep having an upset stomach (indigestion) or heartburn. Get help right away  if:  You have sudden, sharp pain in your belly (abdomen).  You have belly pain that does not go away.  You have bloody poop (stool) or black, tarry poop.  You throw up blood. It may look like coffee grounds.  You feel light-headed or feel like you may pass out (faint).  You get weak.  You get sweaty or feel sticky and cold to the touch (clammy). Summary  Symptoms of a peptic ulcer include burning pain in the area between the chest and the belly button.  Take medicines only as told by your doctor.  Limit how much alcohol and caffeine you have.  Keep all follow-up visits as told by your doctor. This information is not intended to replace advice given to you by your health care provider. Make sure you discuss any questions you have with your health care provider. Document Revised: 03/27/2018 Document Reviewed: 03/27/2018 Elsevier Patient Education  2021 [employer].

## 2020-10-29 NOTE — Progress Notes (Signed)
Subjective:    Patient ID: Billy Garcia, male    DOB: [birth year], 30 y.o.   MRN: [id number]  No chief complaint on file.   HPI Patient is a 30 yo with pmh sig for MDD and anxiety who was seen today for f/u. Pt endorses "black stools", diarrhea, "burning in stomach" after binge drinking a 5th of vodka during NYE.  Also endorses gas, bloating, feeling cold.  Denies recent black stools, dizziness, fatigue, nausea, vomiting. Pt notes prior h/o gastric ulcer.    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 12/04/2013    No Known Allergies  ROS General: Denies fever, chills, night sweats, changes in weight, changes in appetite  +feeling cold HEENT: Denies headaches, ear pain, changes in vision, rhinorrhea, sore throat CV: Denies CP, palpitations, SOB, orthopnea Pulm: Denies SOB, cough, wheezing GI: Denies nausea, vomiting, diarrhea, constipation  +LUQ abd pain, flatus, bloating, dark stools-resolved GU: Denies dysuria, hematuria, frequency Msk: Denies muscle cramps, joint pains Neuro: Denies weakness, numbness, tingling Skin: Denies rashes, bruising Psych: Denies hallucinations  + anxiety, depression     Objective:    Blood pressure 118/78, pulse 69, temperature 98.5 F (36.9 C), temperature source Oral, height 6\' 3"  (1.905 m), weight 219 lb (99.3 kg), SpO2 97 %.  Gen. Pleasant, well-nourished, in no distress, normal affect  HEENT: [place]/AT, face symmetric, conjunctiva clear, no scleral icterus, PERRLA, EOMI, nares patent without drainage Lungs: no accessory muscle use, CTAB, no wheezes or rales Cardiovascular: RRR, no m/r/g, no peripheral edema Abdomen: BS present, soft, NT/ND. Musculoskeletal: No deformities, no cyanosis or clubbing, normal tone Neuro:  A&Ox3, CN II-XII intact, normal  gait Skin:  Warm, no lesions/ rash   Wt Readings from Last 3 Encounters:  10/29/20 219 lb (99.3 kg)  08/12/20 204 lb 3.2 oz (92.6 kg)  07/01/20 193 lb (87.5 kg)    Lab Results  Component Value Date   WBC 3.9 05/07/2020   HGB 14.3 05/07/2020   HCT 41.7 05/07/2020   PLT 169 05/07/2020   GLUCOSE 87 05/07/2020   ALT 10 05/07/2020   AST 19 05/07/2020   NA 140 05/07/2020   K 3.7 05/07/2020   CL 103 05/07/2020   CREATININE 0.97 05/07/2020   BUN 10 05/07/2020   CO2 31 05/07/2020   TSH 0.80 05/07/2020    Assessment/Plan:  Acute alcoholic gastritis with hemorrhage  -pt advised to d/c EtOH use, acidic, or spicy foods. -continue prilosec 40 mg daily.   -Given strict precautions for continued or worsened symptoms. - Plan: CBC with Differential/Platelet, TSH, T4, free, Comprehensive metabolic panel, Ambulatory referral to Gastroenterology  Mild episode of recurrent major depressive disorder (HCC) -PHQ 9 score 9.  Was 13 on 08/12/2020 -Continue Paxil 30 mg daily -Discussed restarting counseling -discussed importance of self care -continue to monitor.  F/u 4-6 wks, sooner if needed. -Plan: Paxil 30 mg  GAD (generalized anxiety disorder) -GAD 7 score 10.  Was 11 on 08/12/20 -continue paxil 30 mg daily -given precautions -continue to monitor.  F/u in 4-6 wks, sooner if needed -Plan: Paxil 30 mg  F/u prn  13/10/21, MD

## 2020-10-30 LAB — COMPREHENSIVE METABOLIC PANEL
ALT: 8 U/L (ref 0–53)
AST: 15 U/L (ref 0–37)
Albumin: 4.3 g/dL (ref 3.5–5.2)
Alkaline Phosphatase: 115 U/L (ref 39–117)
BUN: 18 mg/dL (ref 6–23)
CO2: 29 mEq/L (ref 19–32)
Calcium: 9.1 mg/dL (ref 8.4–10.5)
Chloride: 105 mEq/L (ref 96–112)
Creatinine, Ser: 1.01 mg/dL (ref 0.40–1.50)
GFR: 101.34 mL/min (ref >60.00)
Glucose, Bld: 84 mg/dL (ref 70–99)
Potassium: 3.8 mEq/L (ref 3.5–5.1)
Sodium: 142 mEq/L (ref 135–145)
Total Bilirubin: 0.4 mg/dL (ref 0.2–1.2)
Total Protein: 7.2 g/dL (ref 6.0–8.3)

## 2020-10-30 LAB — CBC WITH DIFFERENTIAL/PLATELET
Basophils Absolute: 0.1 10*3/uL (ref 0.0–0.1)
Basophils Relative: 1.5 % (ref 0.0–3.0)
Eosinophils Absolute: 0.1 10*3/uL (ref 0.0–0.7)
Eosinophils Relative: 2.5 % (ref 0.0–5.0)
HCT: 41.5 % (ref 39.0–52.0)
Hemoglobin: 13.8 g/dL (ref 13.0–17.0)
Lymphocytes Relative: 35 % (ref 12.0–46.0)
Lymphs Abs: 1.7 10*3/uL (ref 0.7–4.0)
MCHC: 33.2 g/dL (ref 30.0–36.0)
MCV: 93.4 fl (ref 78.0–100.0)
Monocytes Absolute: 0.5 10*3/uL (ref 0.1–1.0)
Monocytes Relative: 9.3 % (ref 3.0–12.0)
Neutro Abs: 2.5 10*3/uL (ref 1.4–7.7)
Neutrophils Relative %: 51.7 % (ref 43.0–77.0)
Platelets: 187 10*3/uL (ref 150.0–400.0)
RBC: 4.44 Mil/uL (ref 4.22–5.81)
RDW: 13 % (ref 11.5–15.5)
WBC: 4.9 10*3/uL (ref 4.0–10.5)

## 2020-10-30 LAB — TSH: TSH: 0.65 u[IU]/mL (ref 0.35–4.50)

## 2020-10-30 LAB — T4, FREE: Free T4: 0.78 ng/dL (ref 0.60–1.60)

## 2020-11-04 ENCOUNTER — Encounter: Payer: Self-pay | Admitting: Gastroenterology

## 2020-11-04 ENCOUNTER — Ambulatory Visit (INDEPENDENT_AMBULATORY_CARE_PROVIDER_SITE_OTHER): Payer: 59 | Admitting: Gastroenterology

## 2020-11-04 VITALS — BP 108/68 | HR 84 | Ht 76.0 in | Wt 221.0 lb

## 2020-11-04 DIAGNOSIS — R194 Change in bowel habit: Secondary | ICD-10-CM | POA: Diagnosis not present

## 2020-11-04 DIAGNOSIS — R1013 Epigastric pain: Secondary | ICD-10-CM

## 2020-11-04 DIAGNOSIS — R140 Abdominal distension (gaseous): Secondary | ICD-10-CM

## 2020-11-04 NOTE — Progress Notes (Signed)
[facility] Progress Note  Chief Complaint: Abdominal pain  Subjective  History: Billy Garcia was last seen January 08, 2020 for abdominal pain, black stool and belching.  That office note outlines some unhealthy diet and lifestyle choices that he had been making. He was seen by primary care last week for the symptoms.  Billy Garcia had a multitude of symptoms that have been going on for least several weeks.  He reports drinking "750 mL of liquor" on New Year's Eve, he has been eating fast food frequently, and complains of throat pain, black stool, vague abdominal discomfort, gas and bloating and frequent belching. EGD July 15, 2019 with multiple shallow duodenal bulb ulcers.  H. pylori negative on biopsy. He denies aspirin or NSAID use. ROS: Cardiovascular:  no chest pain Respiratory: no dyspnea Mood stable Remainder of systems negative except as above The patient's Past Medical, Family and Social History were reviewed and are on file in the EMR. Past Medical History:  Diagnosis Date  . Perianal abscess, right posterior s/o I&D 10/18/2013 10/18/2013  . 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  AMBULATORY NON FORMULARY MEDICATION, daily. Medication Name: CBD gummies, Disp: , Rfl:  .  cetirizine (ZYRTEC) 10 MG tablet, Take 10 mg by mouth at bedtime., Disp: , Rfl:  .  omeprazole (PRILOSEC) 40 MG capsule, TAKE 1 CAPSULE(40 MG) BY MOUTH DAILY, Disp: 90 capsule, Rfl: 0 .  Oxymetazoline HCl (NASAL SPRAY 12 HOUR NA), Place into the nose daily., Disp: , Rfl:  .  PARoxetine (PAXIL) 30 MG tablet, Take 1 tablet (30 mg total) by mouth daily., Disp: 90 tablet, Rfl: 0   Vital signs in last 24 hrs: Vitals:   11/04/20 1541  BP: 108/68  Pulse: 84   Wt Readings from Last 3 Encounters:  11/04/20 221 lb (100.2 kg)  10/29/20 219 lb (99.3 kg)  08/12/20 204 lb 3.2 oz (92.6 kg)     Physical Exam  He is well-appearing  HEENT: sclera anicteric, oral mucosa moist without lesions  Neck: supple, no thyromegaly, JVD or lymphadenopathy  Cardiac: RRR without murmurs, S1S2 heard, no peripheral edema  Pulm: clear to auscultation bilaterally, normal RR and effort noted  Abdomen: soft, mild LLQ tenderness, with active bowel sounds. No guarding or palpable hepatosplenomegaly.  Skin; warm and dry, no jaundice or rash Scant light brown soft stool, heme neg  Labs:  CBC Latest Ref Rng & Units 10/29/2020 05/07/2020 01/08/2020  WBC 4.0 - 10.5 K/uL 4.9 3.9 4.3  Hemoglobin 13.0 - 17.0 g/dL 25.3 66.4 40.3  Hematocrit 39.0 - 52.0 % 41.5 41.7 43.4  Platelets 150.0 - 400.0 K/uL 187.0 169 172.0   CMP Latest Ref Rng & Units 10/29/2020 05/07/2020  Glucose 70 - 99 mg/dL 84 87  BUN 6 - 23 mg/dL 18 10  Creatinine 4.74 - 1.50 mg/dL 2.59 5.63  Sodium 875 - 145 mEq/L 142 140  Potassium 3.5 - 5.1 mEq/L 3.8 3.7  Chloride 96 - 112 mEq/L 105 103  CO2 19 - 32 mEq/L 29 31  Calcium 8.4 - 10.5 mg/dL 9.1 9.0  Total Protein 6.0 - 8.3 g/dL 7.2 6.9  Total Bilirubin 0.2 - 1.2 mg/dL 0.4 0.8  Alkaline Phos 39 - 117 U/L 115 -  AST 0 - 37 U/L 15 19  ALT 0 - 53 U/L 8 10    ___________________________________________ Radiologic studies:   ____________________________________________ Other:  _____________________________________________ Assessment & Plan  Assessment: Encounter Diagnoses  Name Primary?  . Abdominal pain, epigastric Yes  . Abdominal bloating   . Change in bowel habits    This black stool is not GI blood loss.  His bowel habits are regular with abdominal pain gas and bloating, and I think most or all of it is related to poor diet and lifestyle choices.  He had a few shallow duodenal erosions when I saw him previously despite being H. pylori negative and denying aspirin or NSAID use.  He is concerned that he may have persistent ulcers, he says his mother has the same  concerns. Plan: EGD.  He was agreeable after discussion of procedure and risks.  The benefits and risks of the planned procedure were described in detail with the patient or (when appropriate) their health care proxy.  Risks were outlined as including, but not limited to, bleeding, infection, perforation, adverse medication reaction leading to cardiac or pulmonary decompensation, pancreatitis (if ERCP).  The limitation of incomplete mucosal visualization was also discussed.  No guarantees or warranties were given.  Healthier diet and lifestyle choices are essential for his digestive symptoms to improve.   Charlie Pitter III

## 2020-11-04 NOTE — Patient Instructions (Addendum)
If you are age 29 or older, your body mass index should be between 23-30. Your Body mass index is 26.9 kg/m. If this is out of the aforementioned range listed, please consider follow up with your Primary Care Provider.  If you are age 40 or younger, your body mass index should be between 19-25. Your Body mass index is 26.9 kg/m. If this is out of the aformentioned range listed, please consider follow up with your Primary Care Provider.   Due to recent changes in healthcare laws, you may see the results of your imaging and laboratory studies on MyChart before your provider has had a chance to review them.  We understand that in some cases there may be results that are confusing or concerning to you. Not all laboratory results come back in the same time frame and the provider may be waiting for multiple results in order to interpret others.  Please give [place] 48 hours in order for your provider to thoroughly review all the results before contacting the office for clarification of your results.   It was a pleasure to see you today!  Dr. Myrtie Neither   Food Guidelines for those with chronic digestive trouble:  Many people have difficulty digesting certain foods, causing a variety of distressing and embarrassing symptoms such as abdominal pain, bloating and gas.  These foods may need to be avoided or consumed in small amounts.  Here are some tips that might be helpful for you.  1.   Lactose intolerance is the difficulty or complete inability to digest lactose, the natural sugar in milk and anything made from milk.  This condition is harmless, common, and can begin any time during life.  Some people can digest a modest amount of lactose while others cannot tolerate any.  Also, not all dairy products contain equal amounts of lactose.  For example, hard cheeses such as parmesan have less lactose than soft cheeses such as cheddar.  Yogurt has less lactose than milk or cheese.  Many packaged foods (even many brands of  bread) have milk, so read ingredient lists carefully.  It is difficult to test for lactose intolerance, so just try avoiding lactose as much as possible for a week and see what happens with your symptoms.  If you seem to be lactose intolerant, the best plan is to avoid it (but make sure you get calcium from another source).  The next best thing is to use lactase enzyme supplements, available over the counter everywhere.  Just know that many lactose intolerant people need to take several tablets with each serving of dairy to avoid symptoms.  Lastly, a lot of restaurant food is made with milk or butter.  Many are things you might not suspect, such as mashed potatoes, rice and pasta (cooked with butter) and "grilled" items.  If you are lactose intolerant, it never hurts to ask your server what has milk or butter.  2.   Fiber is an important part of your diet, but not all fiber is well-tolerated.  Insoluble fiber such as bran is often consumed by normal gut bacteria and converted into gas.  Soluble fiber such as oats, squash, carrots and green beans are typically tolerated better.  3.   Some types of carbohydrates can be poorly digested.  Examples include: fructose (apples, cherries, pears, raisins and other dried fruits), fructans (onions, zucchini, large amounts of wheat), sorbitol/mannitol/xylitol and sucralose/Splenda (common artificial sweeteners), and raffinose (lentils, broccoli, cabbage, asparagus, brussel sprouts, many types of beans).  Do a Programmer, multimedia for [employer] [place] and you will find helpful information. Beano, a dietary supplement, will often help with raffinose-containing foods.  As with lactase tablets, you may need several per serving.  4.   Whenever possible, avoid processed food&meats and chemical additives.  High fructose corn syrup, a common sweetener, may be difficult to digest.  Eggs and soy (comes from the soybean, and added to many foods now) are other common bloating/gassy foods.  5.   Regarding gluten:  gluten is a protein mainly found in wheat, but also rye and barley.  There is a condition called celiac sprue, which is an inflammatory reaction in the small intestine causing a variety of digestive symptoms.  Blood testing is highly reliable to look for this condition, and sometimes upper endoscopy with small bowel biopsies may be necessary to make the diagnosis.  Many patients who test negative for celiac sprue report improvement in their digestive symptoms when they switch to a gluten-free diet.  However, in these "non-celiac gluten sensitive" patients, the true role of gluten in their symptoms is unclear.  Reducing carbohydrates in general may decrease the gas and bloating caused when gut bacteria consume carbs. Also, some of these patients may actually be intolerant of the baker's yeast in bread products rather than the gluten.  Flatbread and other reduced yeast breads might therefore be tolerated.  There is no specific testing available for most food intolerances, which are discovered mainly by dietary elimination.  Please do not embark on a gluten free diet unless directed by your doctor, as it is highly restrictive, and may lead to nutritional deficiencies if not carefully monitored.  Lastly, beware of internet claims offering "personalized" tests for food intolerances.  Such testing has no reliable scientific evidence to support its reliability and correlation to symptoms.    6.  The best advice is old advice, especially for those with chronic digestive trouble - try to eat "clean".  Balanced diet, avoid processed food, plenty of fruits and vegetables, cut down the sugar, minimal alcohol, avoid tobacco. Make time to care for yourself, get enough sleep, exercise when you can, reduce stress.  Your guts will thank you for it.   - Dr. Sherly[facility]

## 2020-11-12 ENCOUNTER — Ambulatory Visit (AMBULATORY_SURGERY_CENTER): Payer: 59 | Admitting: Gastroenterology

## 2020-11-12 ENCOUNTER — Other Ambulatory Visit: Payer: Self-pay

## 2020-11-12 ENCOUNTER — Encounter: Payer: Self-pay | Admitting: Gastroenterology

## 2020-11-12 VITALS — BP 113/62 | HR 70 | Temp 97.7°F | Resp 8 | Ht 76.0 in | Wt 221.0 lb

## 2020-11-12 DIAGNOSIS — K2960 Other gastritis without bleeding: Secondary | ICD-10-CM | POA: Diagnosis not present

## 2020-11-12 DIAGNOSIS — K2950 Unspecified chronic gastritis without bleeding: Secondary | ICD-10-CM

## 2020-11-12 DIAGNOSIS — R1013 Epigastric pain: Secondary | ICD-10-CM

## 2020-11-12 MED ORDER — SODIUM CHLORIDE 0.9 % IV SOLN: 500.0000 mL | INTRAVENOUS | Status: DC

## 2020-11-12 NOTE — Progress Notes (Signed)
PT taken to PACU. Monitors in place. VSS. Report given to RN. 

## 2020-11-12 NOTE — Progress Notes (Signed)
VS by SH  I have reviewed the patient's medical history in detail and updated the computerized patient record.  

## 2020-11-12 NOTE — Patient Instructions (Signed)
YOU HAD AN ENDOSCOPIC PROCEDURE TODAY AT THE [facility]:   Refer to the procedure report that was given to you for any specific questions about what was found during the examination.  If the procedure report does not answer your questions, please call your gastroenterologist to clarify.  If you requested that your care partner not be given the details of your procedure findings, then the procedure report has been included in a sealed envelope for you to review at your convenience later.  **Handout given on Gastritis**  YOU SHOULD EXPECT: Some feelings of bloating in the abdomen. Passage of more gas than usual.  Walking can help get rid of the air that was put into your GI tract during the procedure and reduce the bloating. If you had a lower endoscopy (such as a colonoscopy or flexible sigmoidoscopy) you may notice spotting of blood in your stool or on the toilet paper. If you underwent a bowel prep for your procedure, you may not have a normal bowel movement for a few days.  Please Note:  You might notice some irritation and congestion in your nose or some drainage.  This is from the oxygen used during your procedure.  There is no need for concern and it should clear up in a day or so.  SYMPTOMS TO REPORT IMMEDIATELY:  Following upper endoscopy (EGD)  Vomiting of blood or coffee ground material  New chest pain or pain under the shoulder blades  Painful or persistently difficult swallowing  New shortness of breath  Fever of 100F or higher  Black, tarry-looking stools  For urgent or emergent issues, a gastroenterologist can be reached at any hour by calling [phone]. Do not use MyChart messaging for urgent concerns.    DIET:  We do recommend a small meal at first, but then you may proceed to your regular diet.  Drink plenty of fluids but you should avoid alcoholic beverages for 24 hours.  ACTIVITY:  You should plan to take it easy for the rest of today and you should NOT  DRIVE or use heavy machinery until tomorrow (because of the sedation medicines used during the test).    FOLLOW UP: Our staff will call the number listed on your records 48-72 hours following your procedure to check on you and address any questions or concerns that you may have regarding the information given to you following your procedure. If we do not reach you, we will leave a message.  We will attempt to reach you two times.  During this call, we will ask if you have developed any symptoms of COVID 19. If you develop any symptoms (ie: fever, flu-like symptoms, shortness of breath, cough etc.) before then, please call [phone].  If you test positive for Covid 19 in the 2 weeks post procedure, please call and report this information to us.    If any biopsies were taken you will be contacted by phone or by letter within the next 1-3 weeks.  Please call us at [phone] if you have not heard about the biopsies in 3 weeks.    SIGNATURES/CONFIDENTIALITY: You and/or your care partner have signed paperwork which will be entered into your electronic medical record.  These signatures attest to the fact that that the information above on your After Visit Summary has been reviewed and is understood.  Full responsibility of the confidentiality of this discharge information lies with you and/or your care-partner.  

## 2020-11-12 NOTE — Op Note (Signed)
[facility] Patient Name: Billy Garcia Procedure Date: 11/12/2020 9:09 AM MRN: [id number] Endoscopist: Sherilyn Cooter L. Myrtie Neither , MD Age: 30 Referring MD:  Date of Birth: 11-Apr-1991 Gender: Male Account #: [id number] Procedure:                Upper GI endoscopy Indications:              Epigastric abdominal pain (prior duodenal ulcer                            with Bx neg for H pylori) - recent office note with                            clinical details Medicines:                Monitored Anesthesia Care Procedure:                Pre-Anesthesia Assessment:                           - Prior to the procedure, a History and Physical                            was performed, and patient medications and                            allergies were reviewed. The patient's tolerance of                            previous anesthesia was also reviewed. The risks                            and benefits of the procedure and the sedation                            options and risks were discussed with the patient.                            All questions were answered, and informed consent                            was obtained. Prior Anticoagulants: The patient has                            taken no previous anticoagulant or antiplatelet                            agents. ASA Grade Assessment: II - A patient with                            mild systemic disease. After reviewing the risks                            and benefits, the patient was deemed in  satisfactory condition to undergo the procedure.                           After obtaining informed consent, the endoscope was                            passed under direct vision. Throughout the                            procedure, the patient's blood pressure, pulse, and                            oxygen saturations were monitored continuously. The                            Endoscope was introduced through the  mouth, and                            advanced to the second part of duodenum. The upper                            GI endoscopy was accomplished without difficulty.                            The patient tolerated the procedure well. Scope In: Scope Out: Findings:                 The larynx was normal.                           The esophagus was normal.                           A few erosions with no bleeding and no stigmata of                            recent bleeding were found in the gastric antrum.                            Several biopsies were obtained on the greater                            curvature of the gastric body, on the lesser                            curvature of the gastric body, on the greater                            curvature of the gastric antrum and on the lesser                            curvature of the gastric antrum with cold forceps  for histology.                           The exam of the stomach was otherwise normal.                           The cardia and gastric fundus were normal on                            retroflexion.                           A healed ulcer was found in the duodenal bulb.                           The exam of the duodenum was otherwise normal. Complications:            No immediate complications. Estimated Blood Loss:     Estimated blood loss was minimal. Impression:               - Normal larynx.                           - Normal esophagus.                           - Gastric erosions with no bleeding and no stigmata                            of recent bleeding.                           - Duodenal scar.                           - Several biopsies were obtained on the greater                            curvature of the gastric body, on the lesser                            curvature of the gastric body, on the greater                            curvature of the gastric antrum and on the  lesser                            curvature of the gastric antrum. Recommendation:           - Patient has a contact number available for                            emergencies. The signs and symptoms of potential                            delayed complications were discussed with the  patient. Return to normal activities tomorrow.                            Written discharge instructions were provided to the                            patient.                           - Resume previous diet.                           - Continue present medications.                           - Await pathology results.                           - Office note describes diet and lifestyle choices                            necessary to improve symptoms. When that is                            achieved, omeprazole can be discontinued. Henry L. Myrtie Neither, MD 11/12/2020 9:24:48 AM This report has been signed electronically.

## 2020-11-16 ENCOUNTER — Telehealth: Payer: Self-pay | Admitting: *Deleted

## 2020-11-16 NOTE — Telephone Encounter (Signed)
Attempted f/u phone call. No answer. Left message. °

## 2020-11-16 NOTE — Telephone Encounter (Signed)
  Follow up Call-  Call back number 11/12/2020 07/15/2019  Post procedure Call Back phone  # [phone]  Permission to leave phone message Yes Yes  Some recent data might be hidden     Patient questions:  Do you have a fever, pain , or abdominal swelling? No. Pain Score  0 *  Have you tolerated food without any problems? Yes.    Have you been able to return to your normal activities? Yes.    Do you have any questions about your discharge instructions: Diet   No. Medications  No. Follow up visit  No.  Do you have questions or concerns about your Care? No.  Actions: * If pain score is 4 or above: No action needed, pain <4.   1. Have you developed a fever since your procedure? no  2.   Have you had an respiratory symptoms (SOB or cough) since your procedure? no  3.   Have you tested positive for COVID 19 since your procedure no  4.   Have you had any family members/close contacts diagnosed with the COVID 19 since your procedure?  no

## 2020-11-17 ENCOUNTER — Encounter: Payer: Self-pay | Admitting: Family Medicine

## 2020-11-17 MED ORDER — PAROXETINE HCL 30 MG PO TABS: 30.0000 mg | ORAL_TABLET | Freq: Every day | ORAL | 0 refills | Status: AC

## 2020-11-19 ENCOUNTER — Encounter: Payer: Self-pay | Admitting: Gastroenterology

## 2020-11-29 ENCOUNTER — Other Ambulatory Visit: Payer: Self-pay | Admitting: Family Medicine

## 2020-12-03 ENCOUNTER — Ambulatory Visit: Payer: 59 | Admitting: Family Medicine

## 2020-12-03 ENCOUNTER — Other Ambulatory Visit: Payer: Self-pay

## 2020-12-03 ENCOUNTER — Encounter: Payer: Self-pay | Admitting: Family Medicine

## 2020-12-03 VITALS — BP 122/68 | HR 63 | Temp 98.5°F | Wt 227.0 lb

## 2020-12-03 DIAGNOSIS — R635 Abnormal weight gain: Secondary | ICD-10-CM | POA: Diagnosis not present

## 2020-12-03 DIAGNOSIS — F419 Anxiety disorder, unspecified: Secondary | ICD-10-CM

## 2020-12-03 DIAGNOSIS — F320 Major depressive disorder, single episode, mild: Secondary | ICD-10-CM | POA: Diagnosis not present

## 2020-12-03 NOTE — Patient Instructions (Signed)
Healthy Eating Following a healthy eating pattern may help you to achieve and maintain a healthy body weight, reduce the risk of chronic disease, and live a long and productive life. It is important to follow a healthy eating pattern at an appropriate calorie level for your body. Your nutritional needs should be met primarily through food by choosing a variety of nutrient-rich foods. What are tips for following this plan? Reading food labels  Read labels and choose the following: ? Reduced or low sodium. ? Juices with 100% fruit juice. ? Foods with low saturated fats and high polyunsaturated and monounsaturated fats. ? Foods with whole grains, such as whole wheat, cracked wheat, brown rice, and wild rice. ? Whole grains that are fortified with folic acid. This is recommended for women who are pregnant or who want to become pregnant.  Read labels and avoid the following: ? Foods with a lot of added sugars. These include foods that contain brown sugar, corn sweetener, corn syrup, dextrose, fructose, glucose, high-fructose corn syrup, honey, invert sugar, lactose, malt syrup, maltose, molasses, raw sugar, sucrose, trehalose, or turbinado sugar.  Do not eat more than the following amounts of added sugar per day:  6 teaspoons (25 g) for women.  9 teaspoons (38 g) for men. ? Foods that contain processed or refined starches and grains. ? Refined grain products, such as white flour, degermed cornmeal, white bread, and white rice. Shopping  Choose nutrient-rich snacks, such as vegetables, whole fruits, and nuts. Avoid high-calorie and high-sugar snacks, such as potato chips, fruit snacks, and candy.  Use oil-based dressings and spreads on foods instead of solid fats such as butter, stick margarine, or cream cheese.  Limit pre-made sauces, mixes, and "instant" products such as flavored rice, instant noodles, and ready-made pasta.  Try more plant-protein sources, such as tofu, tempeh, black beans,  edamame, lentils, nuts, and seeds.  Explore eating plans such as the Mediterranean diet or vegetarian diet. Cooking  Use oil to saut or stir-fry foods instead of solid fats such as butter, stick margarine, or lard.  Try baking, boiling, grilling, or broiling instead of frying.  Remove the fatty part of meats before cooking.  Steam vegetables in water or broth. Meal planning  At meals, imagine dividing your plate into fourths: ? One-half of your plate is fruits and vegetables. ? One-fourth of your plate is whole grains. ? One-fourth of your plate is protein, especially lean meats, poultry, eggs, tofu, beans, or nuts.  Include low-fat dairy as part of your daily diet.   Lifestyle  Choose healthy options in all settings, including home, work, school, restaurants, or stores.  Prepare your food safely: ? Wash your hands after handling raw meats. ? Keep food preparation surfaces clean by regularly washing with hot, soapy water. ? Keep raw meats separate from ready-to-eat foods, such as fruits and vegetables. ? Cook seafood, meat, poultry, and eggs to the recommended internal temperature. ? Store foods at safe temperatures. In general:  Keep cold foods at 7F (4.4C) or below.  Keep hot foods at 17F (60C) or above.  Keep your freezer at [facility] (-17.8C) or below.  Foods are no longer safe to eat when they have been between the temperatures of 40-17F (4.4-60C) for more than 2 hours. What foods should I eat? Fruits Aim to eat 2 cup-equivalents of fresh, canned (in natural juice), or frozen fruits each day. Examples of 1 cup-equivalent of fruit include 1 small apple, 8 large strawberries, 1 cup canned fruit,  cup dried fruit, or 1 cup 100% juice. Vegetables Aim to eat 2-3 cup-equivalents of fresh and frozen vegetables each day, including different varieties and colors. Examples of 1 cup-equivalent of vegetables include 2 medium carrots, 2 cups raw, leafy greens, 1 cup chopped  vegetable (raw or cooked), or 1 medium baked potato. Grains Aim to eat 6 ounce-equivalents of whole grains each day. Examples of 1 ounce-equivalent of grains include 1 slice of bread, 1 cup ready-to-eat cereal, 3 cups popcorn, or  cup cooked rice, pasta, or cereal. Meats and other proteins Aim to eat 5-6 ounce-equivalents of protein each day. Examples of 1 ounce-equivalent of protein include 1 egg, 1/2 cup nuts or seeds, or 1 tablespoon (16 g) peanut butter. A cut of meat or fish that is the size of a deck of cards is about 3-4 ounce-equivalents.  Of the protein you eat each week, try to have at least 8 ounces come from seafood. This includes salmon, trout, herring, and anchovies. Dairy Aim to eat 3 cup-equivalents of fat-free or low-fat dairy each day. Examples of 1 cup-equivalent of dairy include 1 cup (240 mL) milk, 8 ounces (250 g) yogurt, 1 ounces (44 g) natural cheese, or 1 cup (240 mL) fortified soy milk. Fats and oils  Aim for about 5 teaspoons (21 g) per day. Choose monounsaturated fats, such as canola and olive oils, avocados, peanut butter, and most nuts, or polyunsaturated fats, such as sunflower, corn, and soybean oils, walnuts, pine nuts, sesame seeds, sunflower seeds, and flaxseed. Beverages  Aim for six 8-oz glasses of water per day. Limit coffee to three to five 8-oz cups per day.  Limit caffeinated beverages that have added calories, such as soda and energy drinks.  Limit alcohol intake to no more than 1 drink a day for nonpregnant women and 2 drinks a day for men. One drink equals 12 oz of beer (355 mL), 5 oz of wine (148 mL), or 1 oz of hard liquor (44 mL). Seasoning and other foods  Avoid adding excess amounts of salt to your foods. Try flavoring foods with herbs and spices instead of salt.  Avoid adding sugar to foods.  Try using oil-based dressings, sauces, and spreads instead of solid fats. This information is based on general [place] nutrition guidelines. For more  information, visit choosemyplate.gov. Exact amounts may vary based on your nutrition needs. Summary  A healthy eating plan may help you to maintain a healthy weight, reduce the risk of chronic diseases, and stay active throughout your life.  Plan your meals. Make sure you eat the right portions of a variety of nutrient-rich foods.  Try baking, boiling, grilling, or broiling instead of frying.  Choose healthy options in all settings, including home, work, school, restaurants, or stores. This information is not intended to replace advice given to you by your health care provider. Make sure you discuss any questions you have with your health care provider. Document Revised: 01/01/2018 Document Reviewed: 01/01/2018 Elsevier Patient Education  2021 [employer]  

## 2020-12-03 NOTE — Progress Notes (Signed)
Subjective:    Patient ID: Billy Garcia, male    DOB: 1991/03/18, 30 y.o.   MRN: [id number]  Chief Complaint  Patient presents with  . Follow-up    HPI Patient was seen today for f/u on anxiety and depression.  Pt notes some improvement in anxiety symptoms on Paxil 30 mg.  Endorses weight gain.  Taking a break from drinking EtOH since last [facility]V.  Eating greasy, fast food, and take out.  Not eating many vegetables.     Past Medical History:  Diagnosis Date  . Allergy   . Anxiety   . Depression   . Perianal abscess, right posterior s/o I&D 10/18/2013 10/18/2013  . 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 12/04/2013    No Known Allergies  ROS General: Denies fever, chills, night sweats, changes in appetite  +weight gain HEENT: Denies headaches, ear pain, changes in vision, rhinorrhea, sore throat CV: Denies CP, palpitations, SOB, orthopnea Pulm: Denies SOB, cough, wheezing GI: Denies abdominal pain, nausea, vomiting, diarrhea, constipation GU: Denies dysuria, hematuria, frequency Msk: Denies muscle cramps, joint pains Neuro: Denies weakness, numbness, tingling Skin: Denies rashes, bruising Psych: Denies hallucinations  +anxiety, depression     Objective:    Blood pressure 122/68, pulse 63, temperature 98.5 F (36.9 C), temperature source Oral, weight 227 lb (103 kg), SpO2 97 %.  Gen. Pleasant, well-nourished, in no distress, normal affect   HEENT: [place]/AT, face symmetric, conjunctiva clear, no scleral icterus, PERRLA, EOMI, nares patent without drainage Lungs: no accessory muscle use Cardiovascular: RRR, no peripheral edema Musculoskeletal: No deformities, no cyanosis or clubbing, normal tone Neuro:  A&Ox3, CN II-XII intact, normal gait Skin:  Warm, no lesions/ rash   Wt Readings from Last 3 Encounters:  12/03/20 227 lb (103 kg)  11/12/20 221 lb (100.2 kg)  11/04/20 221 lb (100.2 kg)    Lab Results  Component Value Date   WBC 4.9  10/29/2020   HGB 13.8 10/29/2020   HCT 41.5 10/29/2020   PLT 187.0 10/29/2020   GLUCOSE 84 10/29/2020   ALT 8 10/29/2020   AST 15 10/29/2020   NA 142 10/29/2020   K 3.8 10/29/2020   CL 105 10/29/2020   CREATININE 1.01 10/29/2020   BUN 18 10/29/2020   CO2 29 10/29/2020   TSH 0.65 10/29/2020    Assessment/Plan:  Depression, major, single episode, mild (HCC) -PHQ 9 score 11 -consider restarting counseling -self care -continue paxil 30 mg daily  Anxiety -improving -GAD 7 score 6.  Was 10 on 10/29/20 -continue Paxil 30 mg daily  Weight gain -Discussed eating a balanced diet and other lifestyle modifications -Patient encouraged to increase physical activity -given handout  F/u in 2 months, sooner if needed.  Abbe Amsterdam, MD

## 2020-12-15 ENCOUNTER — Encounter: Payer: Self-pay | Admitting: Family Medicine

## 2023-10-11 ENCOUNTER — Telehealth: Payer: Self-pay | Admitting: Family Medicine

## 2023-10-11 NOTE — Telephone Encounter (Signed)
 MD last saw Pt on 12/03/20. Called Pt to schedule a CPE.  Pt stated he has switched to [facility].
